# Patient Record
Sex: Female | Born: 1940 | Race: Black or African American | Hispanic: No | Marital: Married | Smoking: Former smoker
Health system: Southern US, Community
[De-identification: ages and names within clinical notes are randomized; demographics above are authoritative.]

## PROBLEM LIST (undated history)

## (undated) DIAGNOSIS — K219 Gastro-esophageal reflux disease without esophagitis: Secondary | ICD-10-CM

## (undated) DIAGNOSIS — E042 Nontoxic multinodular goiter: Secondary | ICD-10-CM

## (undated) DIAGNOSIS — D649 Anemia, unspecified: Secondary | ICD-10-CM

## (undated) DIAGNOSIS — I10 Essential (primary) hypertension: Secondary | ICD-10-CM

## (undated) DIAGNOSIS — J189 Pneumonia, unspecified organism: Secondary | ICD-10-CM

## (undated) HISTORY — PX: MULTIPLE TOOTH EXTRACTIONS: SHX2053

## (undated) HISTORY — PX: TOE AMPUTATION: SHX809

## (undated) HISTORY — DX: Essential (primary) hypertension: I10

---

## 2015-04-12 ENCOUNTER — Other Ambulatory Visit (HOSPITAL_COMMUNITY): Payer: Self-pay | Admitting: "Endocrinology

## 2015-04-12 DIAGNOSIS — E049 Nontoxic goiter, unspecified: Secondary | ICD-10-CM

## 2015-04-19 ENCOUNTER — Ambulatory Visit (HOSPITAL_COMMUNITY)
Admission: RE | Admit: 2015-04-19 | Discharge: 2015-04-19 | Disposition: A | Discharge status: Home / Self Care | Payer: Medicare Other | Source: Ambulatory Visit | Attending: "Endocrinology | Admitting: "Endocrinology

## 2015-04-19 DIAGNOSIS — E042 Nontoxic multinodular goiter: Secondary | ICD-10-CM | POA: Diagnosis not present

## 2015-04-19 DIAGNOSIS — E049 Nontoxic goiter, unspecified: Secondary | ICD-10-CM

## 2015-04-20 ENCOUNTER — Ambulatory Visit (HOSPITAL_COMMUNITY): Payer: Medicare Other

## 2015-04-26 ENCOUNTER — Other Ambulatory Visit: Payer: Self-pay | Admitting: "Endocrinology

## 2015-04-26 DIAGNOSIS — E041 Nontoxic single thyroid nodule: Secondary | ICD-10-CM

## 2015-05-02 ENCOUNTER — Other Ambulatory Visit: Payer: Self-pay | Admitting: "Endocrinology

## 2015-05-02 ENCOUNTER — Ambulatory Visit (HOSPITAL_COMMUNITY)
Admission: RE | Admit: 2015-05-02 | Discharge: 2015-05-02 | Disposition: A | Discharge status: Home / Self Care | Payer: Medicare Other | Source: Ambulatory Visit | Attending: "Endocrinology | Admitting: "Endocrinology

## 2015-05-02 DIAGNOSIS — E042 Nontoxic multinodular goiter: Secondary | ICD-10-CM | POA: Insufficient documentation

## 2015-05-02 DIAGNOSIS — E041 Nontoxic single thyroid nodule: Secondary | ICD-10-CM

## 2015-05-02 MED ORDER — LIDOCAINE HCL (PF) 2 % IJ SOLN
INTRAMUSCULAR | Status: AC
  Administered 2015-05-02: 10 mL
  Filled 2015-05-02: qty 10

## 2015-05-02 NOTE — Procedures (Signed)
PreOperative Dx: Isthmic and RIGHT lobe thyroid nodules Postoperative Dx: Isthmic and RIGHT lobe thyroid nodules Procedure:   US guided FNA of 2 thyroid nodules Radiologist:  Tyron Russell Anesthesia:  3 ml of 2% lidocaine Specimen:  FNA x 3 of RT nodule, FNA x 3 of isthmic nodule  EBL:   < 1 ml Complications: None

## 2015-05-02 NOTE — Discharge Instructions (Signed)
Thyroid Biopsy °The thyroid gland is a butterfly-shaped gland situated in the front of the neck. It produces hormones which affect metabolism, growth and development, and body temperature. A thyroid biopsy is a procedure in which small samples of tissue or fluid are removed from the thyroid gland or mass and examined under a microscope. This test is done to determine the cause of thyroid problems, such as infection, cancer, or other thyroid problems. °There are 2 ways to obtain samples: °1. Fine needle biopsy. Samples are removed using a thin needle inserted through the skin and into the thyroid gland or mass. °2. Open biopsy. Samples are removed after a cut (incision) is made through the skin. °LET YOUR CAREGIVER KNOW ABOUT:  °· Allergies. °· Medications taken including herbs, eye drops, over-the-counter medications, and creams. °· Use of steroids (by mouth or creams). °· Previous problems with anesthetics or numbing medicine. °· Possibility of pregnancy, if this applies. °· History of blood clots (thrombophlebitis). °· History of bleeding or blood problems. °· Previous surgery. °· Other health problems. °RISKS AND COMPLICATIONS °· Bleeding from the site. The risk of bleeding is higher if you have a bleeding disorder or are taking any blood thinning medications (anticoagulants). °· Infection. °· Injury to structures near the thyroid gland. °BEFORE THE PROCEDURE  °This is a procedure that can be done as an outpatient. Confirm the time that you need to arrive for your procedure. Confirm whether there is a need to fast or withhold any medications. A blood sample may be done to determine your blood clotting time. Medicine may be given to help you relax (sedative). °PROCEDURE °Fine needle biopsy. °You will be awake during the procedure. You may be asked to lie on your back with your head tipped backward to extend your neck. Let your caregiver know if you cannot tolerate the positioning. An area on your neck will be  cleansed. A needle is inserted through the skin of your neck. You may feel a mild discomfort during this procedure. You may be asked to avoid coughing, talking, swallowing, or making sounds during some portions of the procedure. The needle is withdrawn once tissue or fluid samples have been removed. Pressure may be applied to the neck to reduce swelling and ensure that bleeding has stopped. The samples will be sent for examination.  °Open biopsy. °You will be given general anesthesia. You will be asleep during the procedure. An incision is made in your neck. A sample of thyroid tissue or the mass is removed. The tissue sample or mass will be sent for examination. The sample or mass may be examined during the biopsy. If the sample or mass contains cancer cells, some or all of the thyroid gland may be removed. The incision is closed with stitches. °AFTER THE PROCEDURE  °Your recovery will be assessed and monitored. If there are no problems, as an outpatient, you should be able to go home shortly after the procedure. °If you had a fine needle biopsy: °· You may have soreness at the biopsy site for 1 to 2 days. °If you had an open biopsy:  °· You may have soreness at the biopsy site for 3 to 4 days. °· You may have a hoarse voice or sore throat for 1 to 2 days. °Obtaining the Test Results °It is your responsibility to obtain your test results. Do not assume everything is normal if you have not heard from your caregiver or the medical facility. It is important for you to follow up   on all of your test results. °HOME CARE INSTRUCTIONS  °· Keeping your head raised on a pillow when you are lying down may ease biopsy site discomfort. °· Supporting the back of your head and neck with both hands as you sit up from a lying position may ease biopsy site discomfort. °· Only take over-the-counter or prescription medicines for pain, discomfort, or fever as directed by your caregiver. °· Throat lozenges or gargling with warm salt  water may help to soothe a sore throat. °SEEK IMMEDIATE MEDICAL CARE IF:  °· You have severe bleeding from the biopsy site. °· You have difficulty swallowing. °· You have a fever. °· You have increased pain, swelling, redness, or warmth at the biopsy site. °· You notice pus coming from the biopsy site. °· You have swollen glands (lymph nodes) in your neck. °Document Released: 06/15/2007 Document Revised: 12/13/2012 Document Reviewed: 11/10/2013 °Exit[employer]® Patient Information ©2015 [employer]. This information is not intended to replace advice given to you by your health care provider. Make sure you discuss any questions you have with your health care provider. ° °

## 2015-06-05 ENCOUNTER — Ambulatory Visit: Payer: Self-pay | Admitting: "Endocrinology

## 2015-06-06 ENCOUNTER — Ambulatory Visit: Payer: Self-pay | Admitting: "Endocrinology

## 2015-11-05 ENCOUNTER — Ambulatory Visit: Payer: Self-pay | Admitting: "Endocrinology

## 2017-04-30 IMAGING — US US THYROID BIOPSY
1 series · 11 of 11 positions shown · non-contrast
Comparison: Thyroid ultrasound 04/19/2015

CLINICAL DATA: Multiple thyroid nodules

EXAM:
ULTRASOUND GUIDED NEEDLE ASPIRATE BIOPSY OF THE THYROID GLAND
ULTRASOUND-GUIDED NEEDLE ASPIRATE BIOPSY OF ADDITIONAL THYROID
NODULE

[Series 1: us thyroid biopsy · 0.05mm/px · 11 acquisitions, 11 frames shown]
[im 1/11]
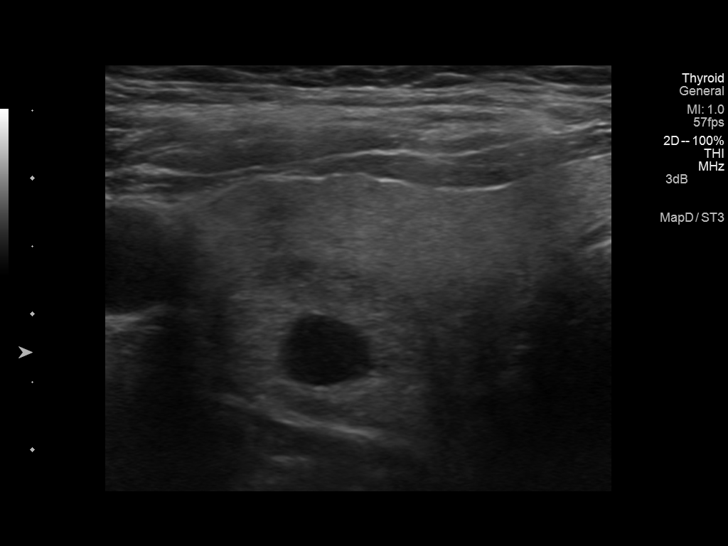
[im 2/11]
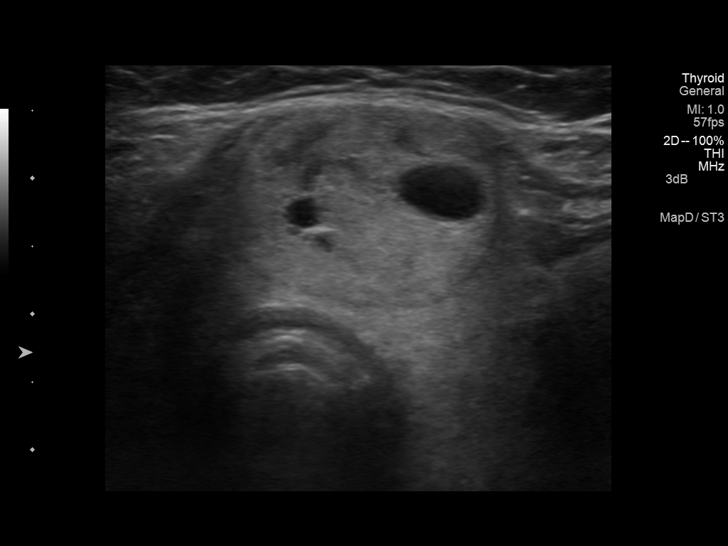
[im 3/11]
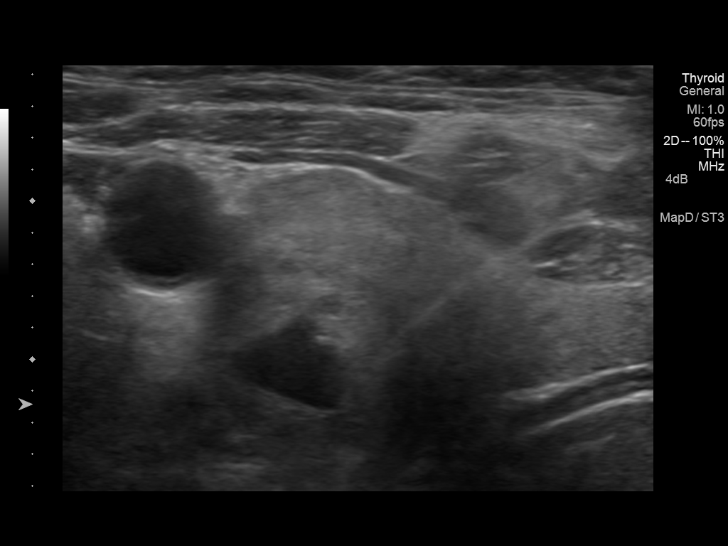
[im 4/11]
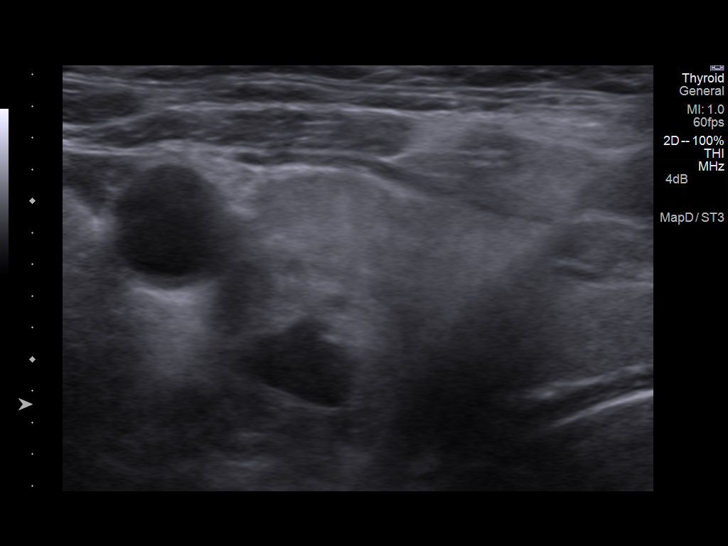
[im 5/11]
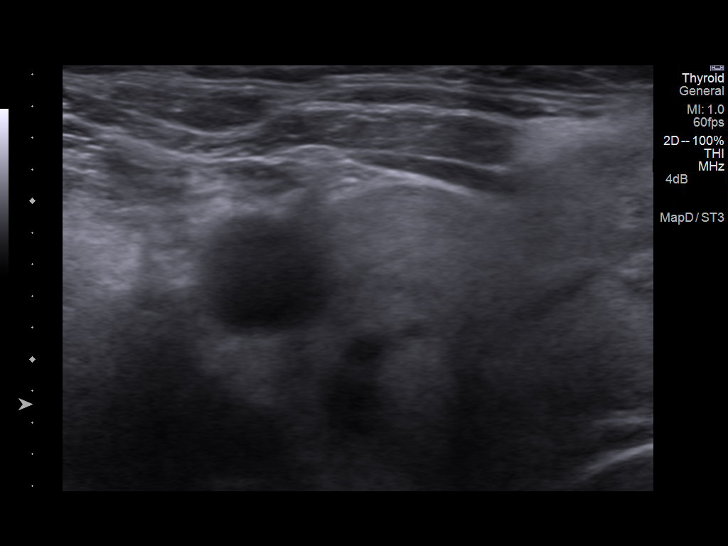
[im 6/11]
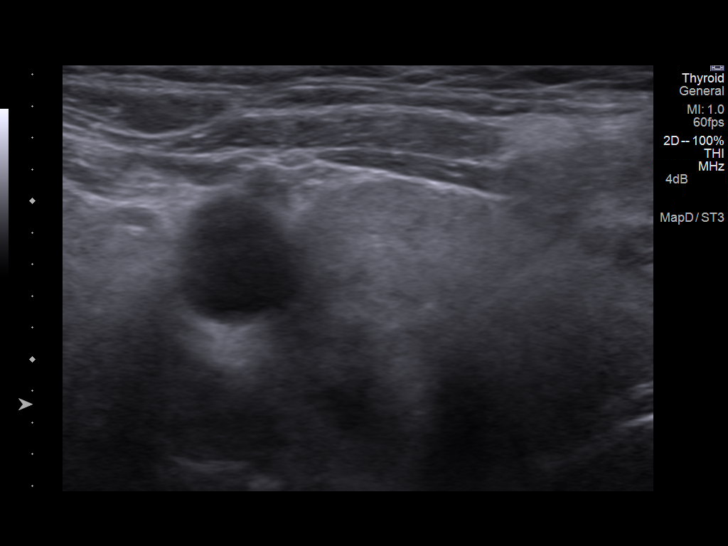
[im 7/11]
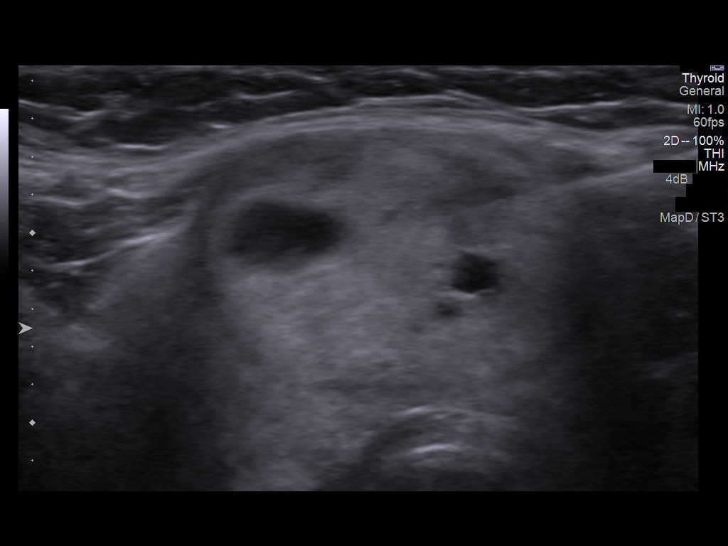
[im 8/11]
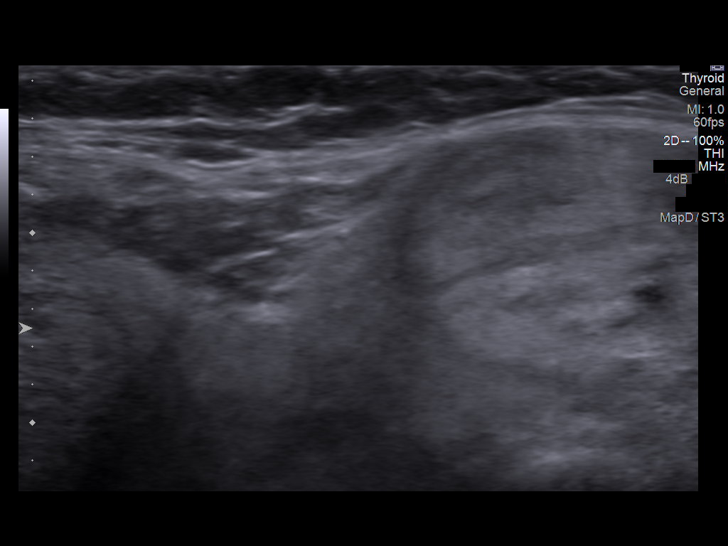
[im 9/11]
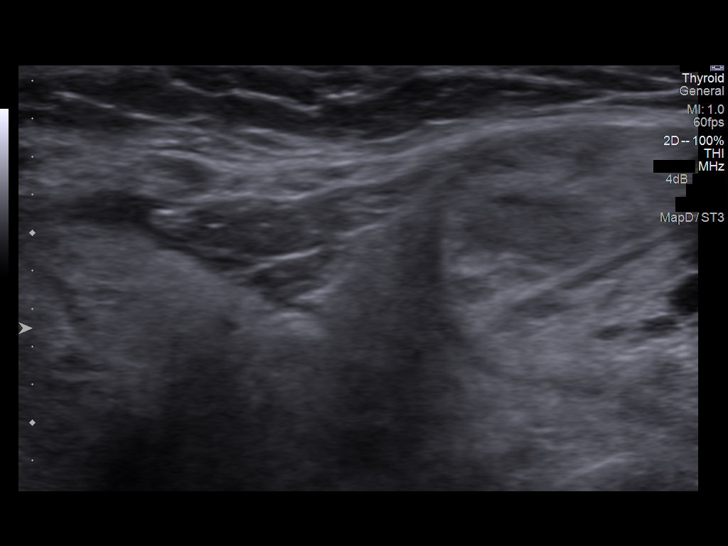
[im 10/11]
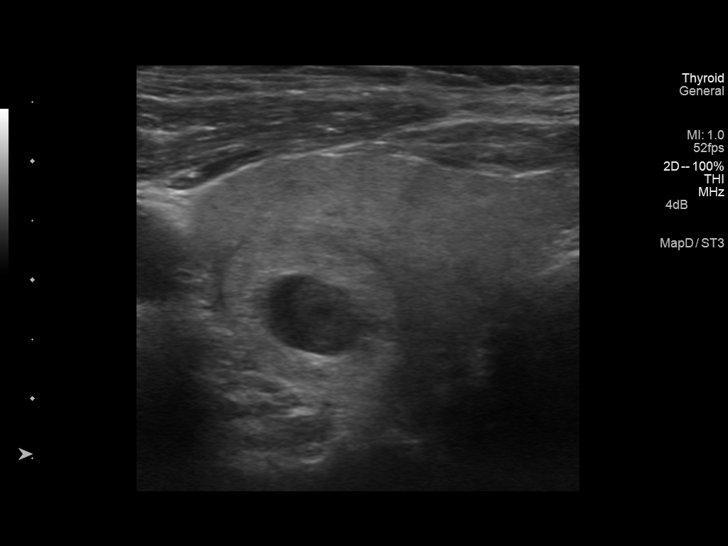
[im 11/11]
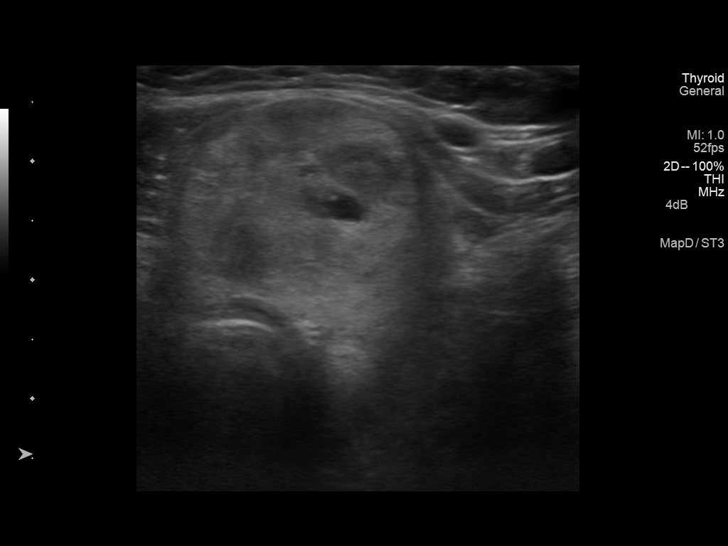

[11 of 11 positions shown; findings below may reference images not displayed]

PROCEDURE:
Procedure, risks, benefits and alternatives discussed with the
patient.

Patient's questions answered.

Written informed consent for thyroid nodule FNA obtained.

Time-out protocol followed.

Dominant nodule in RIGHT lobe localized by ultrasound, measuring
cm in transverse dimension and containing a cystic core with a thick
rim of soft tissue.

Additional nodule in thyroid isthmus localized by ultrasound,
measuring 2.1 cm transverse, predominantly solid with small cystic
foci.

Skin prepped and draped in usual sterile fashion.

Skin and overlying soft tissues anesthetized with 3 mL of 2%
lidocaine.

Under direct sonographic visualization, 3 fine-needle aspirates of
the RIGHT lobe mass were obtained.

Procedure tolerated well by patient.

Specimens of appeared visually adequate.

Subsequently, under direct sonographic visualization, 3 fine-needle
aspirates of the isthmic mass were obtained.

Specimens appeared visually adequate.

Specimens sent to cytology for evaluation.

No immediate complications; no evidence of hemorrhage on
postprocedural imaging.

COMPLICATIONS:
None
FINDINGS: As above
IMPRESSION: Ultrasound guided needle aspirate biopsy performed of of isthmic and
RIGHT lobe thyroid nodules.

## 2017-05-14 IMAGING — US US SOFT TISSUE HEAD/NECK
1 series · 14 of 25 positions shown · non-contrast
Comparison: None.

CLINICAL DATA: Goiter

EXAM:
THYROID ULTRASOUND
TECHNIQUE: Ultrasound examination of the thyroid gland and adjacent soft
tissues was performed.

[Series 1: us soft tissue head/neck · 0.06mm/px · 14 of 46 slices shown]
[im 1/46]
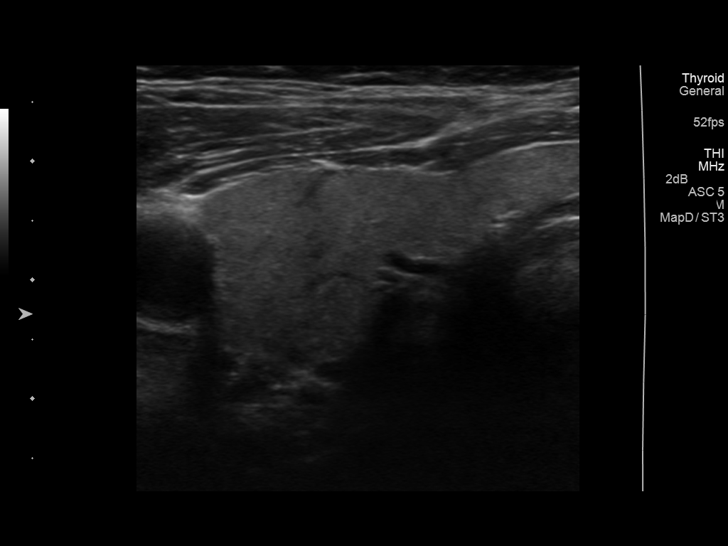
[im 4/46]
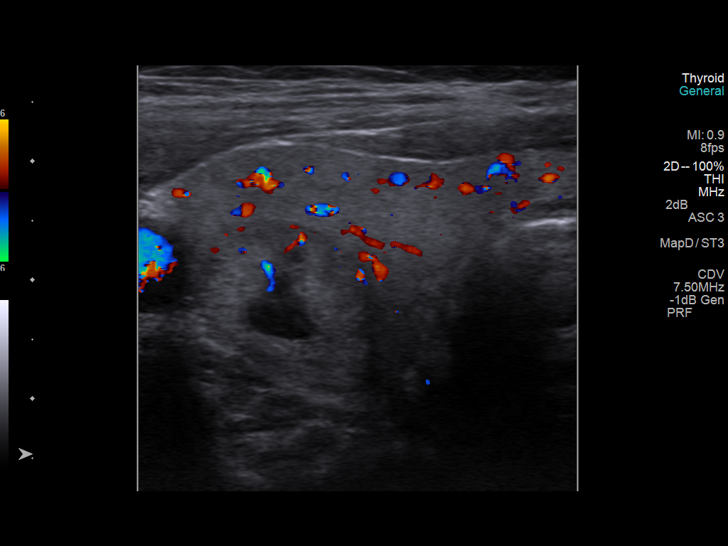
[im 8/46]
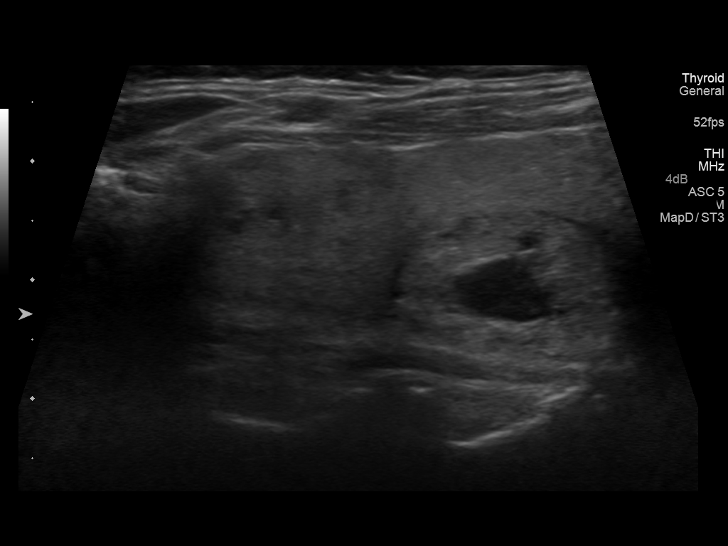
[im 12/46]
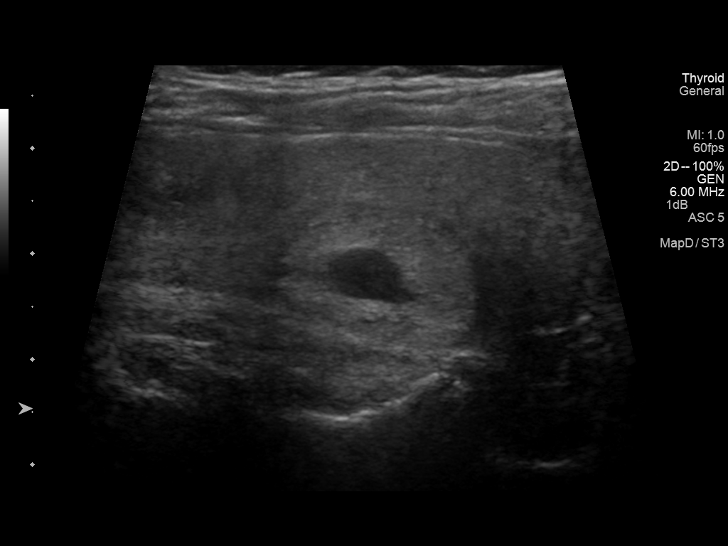
[im 16/46]
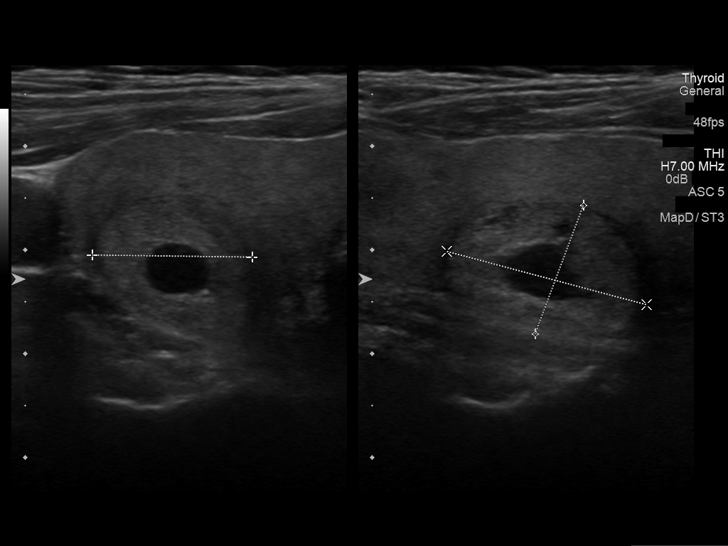
[im 17/46]
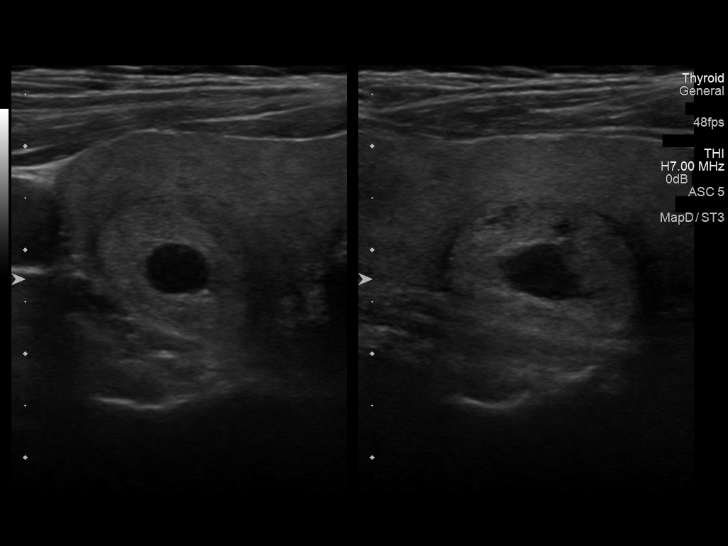
[im 21/46]
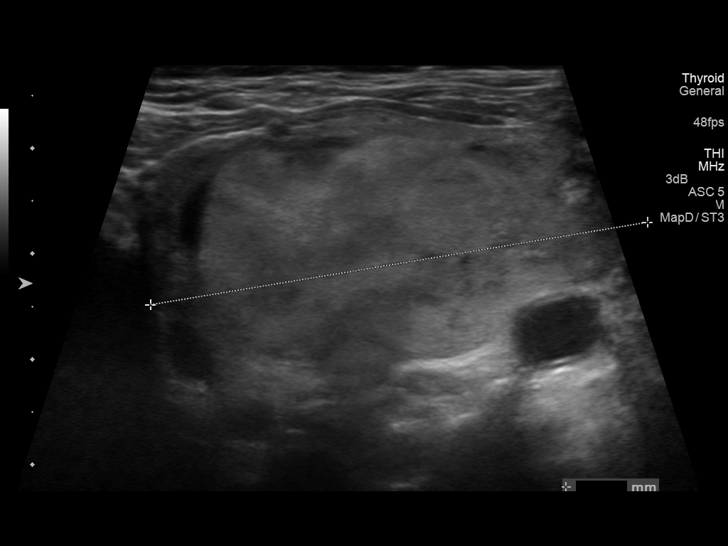
[im 25/46]
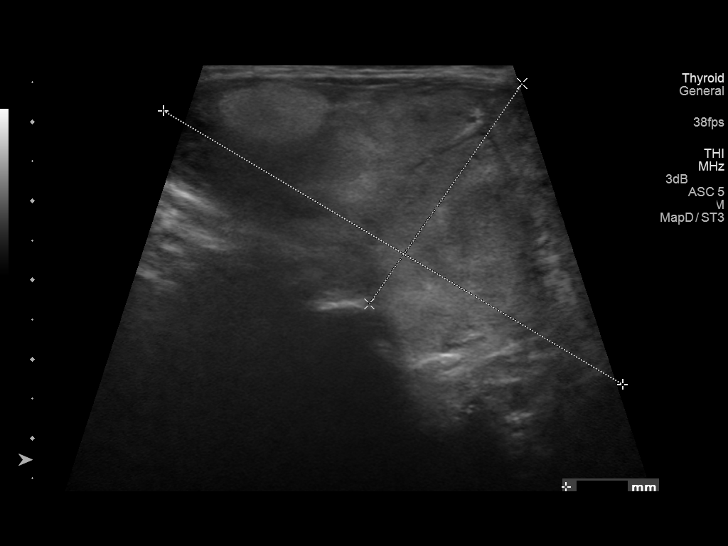
[im 29/46]
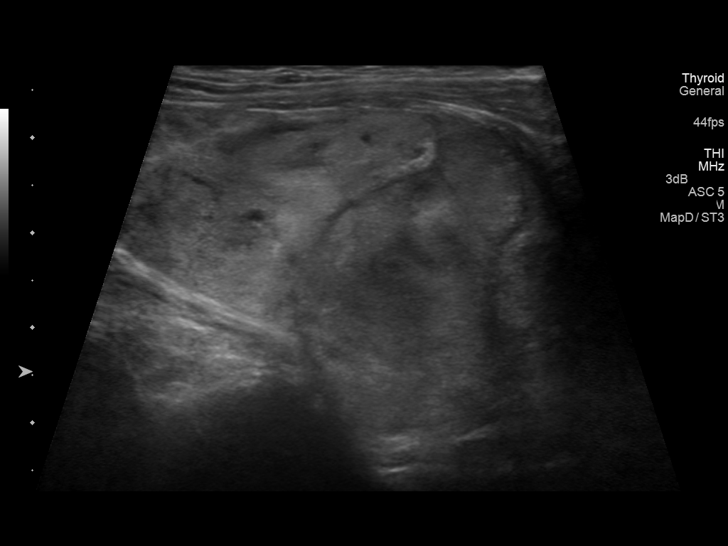
[im 31/46]
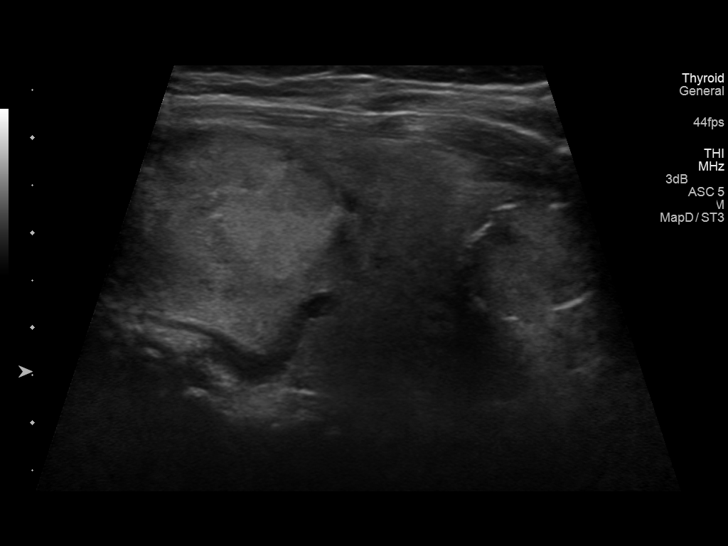
[im 34/46]
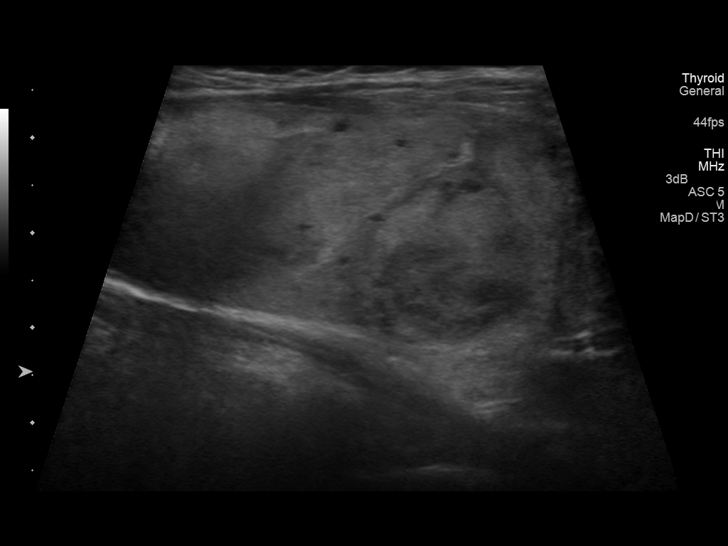
[im 38/46]
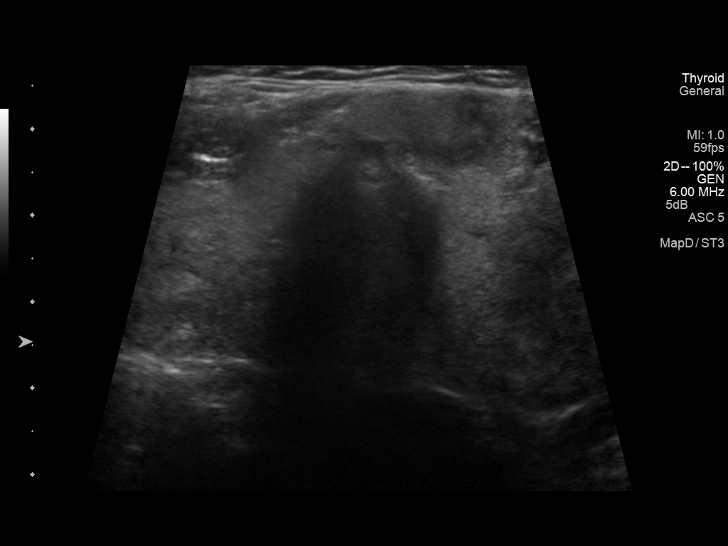
[im 42/46]
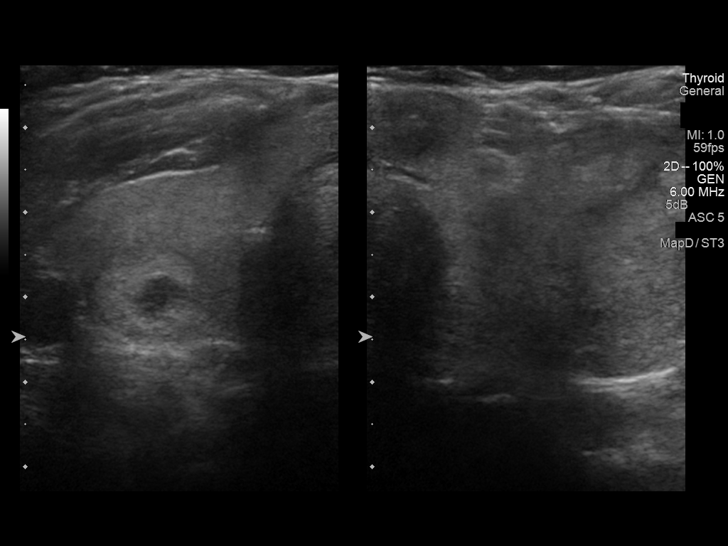
[im 46/46]
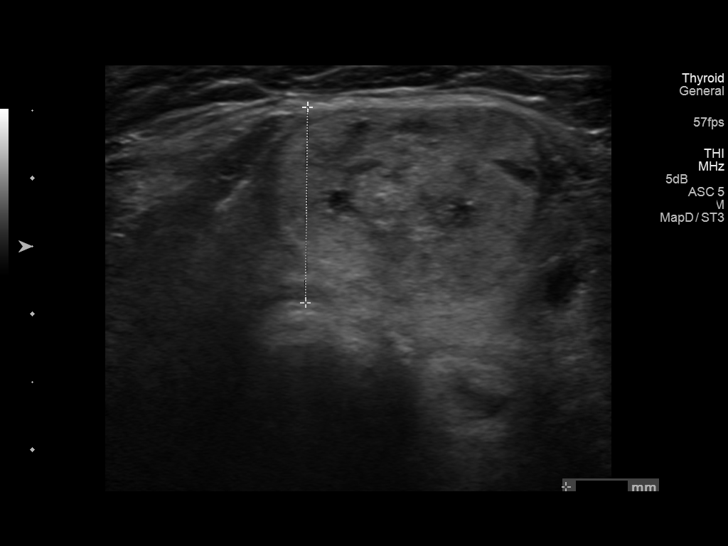

[14 of 25 positions shown; findings below may reference images not displayed]

FINDINGS: Right thyroid lobe

Measurements: 6.0 x 2.2 x 2.5 cm. Complex but predominantly solid
mid lobe nodule measures 2.0 x 1.3 x 1.5 cm. There is some internal
vascularity.

Left thyroid lobe

Measurements: 6.8 x 3.4 x 4.8 cm. Heterogeneous without focal
nodule.

Isthmus

Thickness: 1.4 cm.  Solid nodule measures 2.4 x 1.7 x 2.0 cm.

Lymphadenopathy

None visualized.
IMPRESSION: There are right and isthmic nodules as described. The dominant
nodule is in the isthmus measuring 2.4 cm. Findings meet consensus
criteria for biopsy. Ultrasound-guided fine needle aspiration should
be considered, as per the consensus statement: Management of Thyroid
Nodules Detected at US: Society of Radiologists in Ultrasound

## 2018-04-27 LAB — MICROALBUMIN, URINE: MICROALB UR: 123

## 2018-04-27 LAB — TSH: TSH: 2.81 (ref 0.41–5.90)

## 2018-04-27 LAB — BASIC METABOLIC PANEL
BUN: 16 (ref 4–21)
CREATININE: 0.8 (ref 0.5–1.1)

## 2018-04-27 LAB — LIPID PANEL
Cholesterol: 198 (ref 0–200)
HDL: 63 (ref 35–70)
LDL Cholesterol: 115
Triglycerides: 98 (ref 40–160)

## 2018-06-02 ENCOUNTER — Encounter: Payer: Self-pay | Admitting: "Endocrinology

## 2018-06-02 ENCOUNTER — Ambulatory Visit: Payer: Medicare HMO | Admitting: "Endocrinology

## 2018-06-02 VITALS — BP 104/66 | HR 80 | Ht 67.0 in | Wt 161.0 lb

## 2018-06-02 DIAGNOSIS — E042 Nontoxic multinodular goiter: Secondary | ICD-10-CM | POA: Insufficient documentation

## 2018-06-02 NOTE — Progress Notes (Signed)
Endocrinology Consult Note                                            06/02/2018, 7:06 PM   Subjective:    Patient ID: Briana Koch, female    DOB: 06-21-1941, PCP Aggie Cosier, MD   Past Medical History:  Diagnosis Date  . Hypertension    History reviewed. No pertinent surgical history. Social History   Socioeconomic History  . Marital status: Married    Spouse name: Not on file  . Number of children: Not on file  . Years of education: Not on file  . Highest education level: Not on file  Occupational History  . Not on file  Social Needs  . Financial resource strain: Not on file  . Food insecurity:    Worry: Not on file    Inability: Not on file  . Transportation needs:    Medical: Not on file    Non-medical: Not on file  Tobacco Use  . Smoking status: Former Smoker    Types: Cigarettes    Last attempt to quit: 2019    Years since quitting: 0.7  . Smokeless tobacco: Never Used  . Tobacco comment: Pt smoked 3 per week before quitting  Substance and Sexual Activity  . Alcohol use: Not Currently  . Drug use: Never  . Sexual activity: Not on file  Lifestyle  . Physical activity:    Days per week: Not on file    Minutes per session: Not on file  . Stress: Not on file  Relationships  . Social connections:    Talks on phone: Not on file    Gets together: Not on file    Attends religious service: Not on file    Active member of club or organization: Not on file    Attends meetings of clubs or organizations: Not on file    Relationship status: Not on file  Other Topics Concern  . Not on file  Social History Narrative  . Not on file   Outpatient Encounter Medications as of 06/02/2018  Medication Sig  . aspirin EC 81 MG tablet Take 81 mg by mouth daily.  Marland Kitchen amLODipine (NORVASC) 5 MG tablet Take 5 mg by mouth daily.  Marland Kitchen losartan-hydrochlorothiazide (HYZAAR) 100-25 MG tablet daily.   No facility-administered encounter medications on file as of  06/02/2018.    ALLERGIES: No Known Allergies  VACCINATION STATUS:  There is no immunization history on file for this patient.  HPI Briana Koch is 77 y.o. female who presents today with a medical history as above. -This patient was seen previously in this practice for multinodular goiter in 2016 after she has seen Dr. Renaee Munda in [place] for nodular goiter for several years. -She did not return for follow-up until she was rereferred for the same problem. -She is known to have long-standing nodular goiter with little change over time. In 2016 thyroid ultrasound showed right lobe measuring 6 cm with 2.4 cm nodule which was  biopsied with benign findings, left lobe 6.8 cm with unremarkable nodular lesions. Her most recent ultrasound was from March 17, 2017-summarized below. -She continues to have some choking sensation when she lays on her back.  She denies dysphagia, shortness of breath, nor voice change.  -  She has not noticed any recent change in the size of her thyroid.  She is a former heavy smoker.  She denies any family history of thyroid cancer.  Her recent thyroid function tests are within normal limits.    Review of Systems  Constitutional: + Progressive weight gain,  no fatigue, no subjective hyperthermia, no subjective hypothermia Eyes: no blurry vision, no xerophthalmia ENT: no sore throat, no nodules palpated in throat, no dysphagia/odynophagia, no hoarseness, + occasional choking on laying down on her back Cardiovascular: no Chest Pain, no Shortness of Breath, no palpitations, no leg swelling Respiratory: no cough, no SOB Gastrointestinal: no Nausea/Vomiting/Diarhhea Musculoskeletal: no muscle/joint aches Skin: no rashes Neurological: no tremors, no numbness, no tingling, no dizziness Psychiatric: no depression, no anxiety  Objective:    BP 104/66   Pulse 80   Ht 5\' 7"  (1.702 m)   Wt 161 lb (73 kg)   BMI 25.22 kg/m   Wt Readings from Last 3 Encounters:   06/02/18 161 lb (73 kg)    Physical Exam  Constitutional: + Appropriate weight for height, not in acute distress, normal state of mind Eyes: PERRLA, EOMI, no exophthalmos ENT: moist mucous membranes, + visible and palpable thyromegaly with prominent nodule on the right isthmus, it is mobile on swallowing, no cervical lymphadenopathy Cardiovascular: normal precordial activity, Regular Rate and Rhythm, no Murmur/Rubs/Gallops Respiratory:  adequate breathing efforts, no gross chest deformity, Clear to auscultation bilaterally Gastrointestinal: abdomen soft, Non -tender, No distension, Bowel Sounds present Musculoskeletal: no gross deformities, strength intact in all four extremities Skin: moist, warm, no rashes Neurological: no tremor with outstretched hands, Deep tendon reflexes normal in all four extremities.  Thyroid ultrasound on March 17, 2017: Summary Right lobe 6.0 cm (previously 6 cm) with 2.3 cm nodule previously biopsied with benign findings, left lobe 7.3 cm with  nodules (previously 6.8 cm).  The left lobe extends below the clavicle. -There are multiple bilateral nodules, largest is on the right described above. Impression: No significant change since the prior study and re demonstration of thyroid thyromegaly and bilateral thyroid nodules largest in the isthmus as described above.  Recent Results (from the past 2160 hour(s))  Microalbumin, urine     Status: None   Collection Time: 04/27/18 12:00 AM  Result Value Ref Range   Microalb, Ur 123   Basic metabolic panel     Status: None   Collection Time: 04/27/18 12:00 AM  Result Value Ref Range   BUN 16 4 - 21   Creatinine 0.8 0.5 - 1.1  Lipid panel     Status: None   Collection Time: 04/27/18 12:00 AM  Result Value Ref Range   Triglycerides 98 40 - 160   Cholesterol 198 0 - 200   HDL 63 35 - 70   LDL Cholesterol 115   TSH     Status: None   Collection Time: 04/27/18 12:00 AM  Result Value Ref Range   TSH 2.81 0.41 -  5.90    Comment:  free t4 1.02     Assessment & Plan:   1. Multinodular goiter   - Yumi Therien  is being seen at a kind request of Aggie Cosier, MD. - I have reviewed her available thyroid records and clinically evaluated the patient. - Based on reviews, she has euthyroid multinodular goiter. -She has mild compressive symptoms of feeling choking in certain positions.  -Large benign multinodular goiter with significant compressive neck symptoms may warrant surgical intervention. -She wishes to avoid/delay surgery for now. -The prominent nodule in the right isthmus was biopsied and known  to be benign. -She will be on observation for physical exam and with another ultrasound in 1 year.  -Her thyroid function tests are within normal limits, would not require intervention at this time.  - I did not initiate any new prescriptions today. - I advised her  to maintain close follow up with Aggie Cosier, MD for primary care needs.   - Time spent with the patient: 45 minutes, of which >50% was spent in obtaining information about her symptoms, reviewing her previous labs, evaluations, and treatments, counseling her about her long-standing multinodular goiter, and developing a plan to confirm the diagnosis and long term treatment as necessary.  Bettyanne Casimir participated in the discussions, expressed understanding, and voiced agreement with the above plans.  All questions were answered to her satisfaction. she is encouraged to contact clinic should she have any questions or concerns prior to her return visit.  Follow up plan: Return in about 1 year (around 06/03/2019) for Follow up with Pre-visit Labs.   Marquis Lunch, MD [facility] [address] [place], [place] [address] Phone: [phone]  Fax: [phone]     06/02/2018, 7:06 PM  This note was partially dictated with voice recognition software. Similar sounding words can  be transcribed inadequately or may not  be corrected upon review.

## 2019-06-03 ENCOUNTER — Ambulatory Visit: Payer: Medicare HMO | Admitting: "Endocrinology

## 2019-06-09 ENCOUNTER — Other Ambulatory Visit: Payer: Self-pay | Admitting: "Endocrinology

## 2019-06-10 LAB — T3, FREE: T3, Free: 3.1 pg/mL (ref 2.0–4.4)

## 2019-06-10 LAB — T4, FREE: Free T4: 1.28 ng/dL (ref 0.82–1.77)

## 2019-06-10 LAB — TSH: TSH: 1.54 u[IU]/mL (ref 0.450–4.500)

## 2019-07-11 ENCOUNTER — Encounter: Payer: Self-pay | Admitting: "Endocrinology

## 2019-07-11 ENCOUNTER — Other Ambulatory Visit: Payer: Self-pay

## 2019-07-11 ENCOUNTER — Ambulatory Visit (INDEPENDENT_AMBULATORY_CARE_PROVIDER_SITE_OTHER): Payer: Medicare HMO | Admitting: "Endocrinology

## 2019-07-11 DIAGNOSIS — E042 Nontoxic multinodular goiter: Secondary | ICD-10-CM

## 2019-07-11 NOTE — Progress Notes (Signed)
07/11/2019, 3:44 PM                                Endocrinology Telehealth Visit Follow up Note -During COVID -19 Pandemic  I connected with Briana Koch on 07/11/2019   by telephone and verified that I am speaking with the correct person using two identifiers. Briana Koch, 08-31-1941. she has verbally consented to this visit. All issues noted in this document were discussed and addressed. The format was not optimal for physical exam.   Subjective:    Patient ID: Briana Koch, female    DOB: 1941-06-29, PCP Kennieth Rad, MD   Past Medical History:  Diagnosis Date  . Hypertension    History reviewed. No pertinent surgical history. Social History   Socioeconomic History  . Marital status: Married    Spouse name: Not on file  . Number of children: Not on file  . Years of education: Not on file  . Highest education level: Not on file  Occupational History  . Not on file  Social Needs  . Financial resource strain: Not on file  . Food insecurity    Worry: Not on file    Inability: Not on file  . Transportation needs    Medical: Not on file    Non-medical: Not on file  Tobacco Use  . Smoking status: Former Smoker    Types: Cigarettes    Quit date: 2019    Years since quitting: 1.8  . Smokeless tobacco: Never Used  . Tobacco comment: Pt smoked 3 per week before quitting  Substance and Sexual Activity  . Alcohol use: Not Currently  . Drug use: Never  . Sexual activity: Not on file  Lifestyle  . Physical activity    Days per week: Not on file    Minutes per session: Not on file  . Stress: Not on file  Relationships  . Social Herbalist on phone: Not on file    Gets together: Not on file    Attends religious service: Not on file    Active member of club or organization: Not on file    Attends meetings of clubs or organizations: Not on file    Relationship status: Not on file  Other Topics  Concern  . Not on file  Social History Narrative  . Not on file   Outpatient Encounter Medications as of 07/11/2019  Medication Sig  . amLODipine (NORVASC) 5 MG tablet Take 5 mg by mouth daily.  Marland Kitchen aspirin EC 81 MG tablet Take 81 mg by mouth daily.  Marland Kitchen losartan-hydrochlorothiazide (HYZAAR) 100-25 MG tablet daily.   No facility-administered encounter medications on file as of 07/11/2019.    ALLERGIES: No Known Allergies  VACCINATION STATUS:  There is no immunization history on file for this patient.  HPI Briana Koch is 78 y.o. female who presents today with a medical history as above. -This patient was seen previously in this practice for multinodular goiter in 2016 after she has seen Dr. Myles Gip in [place] for nodular goiter for several years.  -She is known to have long-standing nodular goiter with little change over time. In  2016 thyroid ultrasound showed right lobe measuring 6 cm with 2.4 cm nodule which was  biopsied with benign findings, left lobe 6.8 cm with unremarkable nodular lesions. Her most recent ultrasound was from March 17, 2017-summarized below. -She continues to have some choking sensation when she lays on her back.  She denies dysphagia, shortness of breath, nor voice change.  -  She has  noticed some recent change in the size of her thyroid.  She is a former heavy smoker.  She denies any family history of thyroid cancer.  Her recent thyroid function tests are within normal limits.     Review of Systems  Limited as above.  Objective:    There were no vitals taken for this visit.  Wt Readings from Last 3 Encounters:  06/02/18 161 lb (73 kg)    Physical Exam   Thyroid ultrasound on March 17, 2017: Summary Right lobe 6.0 cm (previously 6 cm) with 2.3 cm nodule previously biopsied with benign findings, left lobe 7.3 cm with  nodules (previously 6.8 cm).  The left lobe extends below the clavicle. -There are multiple bilateral nodules, largest is on the  right described above. Impression: No significant change since the prior study and re demonstration of thyroid thyromegaly and bilateral thyroid nodules largest in the isthmus as described above.  Recent Results (from the past 2160 hour(s))  T4, free     Status: None   Collection Time: 06/09/19  1:12 PM  Result Value Ref Range   Free T4 1.28 0.82 - 1.77 ng/dL  TSH     Status: None   Collection Time: 06/09/19  1:12 PM  Result Value Ref Range   TSH 1.540 0.450 - 4.500 uIU/mL  T3, free     Status: None   Collection Time: 06/09/19  1:12 PM  Result Value Ref Range   T3, Free 3.1 2.0 - 4.4 pg/mL     Assessment & Plan:   1. Multinodular goiter   - Coti Weikel  is being seen at a kind request of Aggie Cosier, MD. - I have reviewed her available thyroid records and clinically evaluated the patient. - Based on reviews, she has euthyroid multinodular goiter. -She has mild compressive symptoms of feeling choking in certain positions.  -Large benign multinodular goiter with significant compressive neck symptoms may warrant surgical intervention. -She wishes to avoid/delay surgery for now. -The prominent nodule in the right isthmus was biopsied and known to be benign. -She will be on observation for physical exam and with another ultrasound in 6 months. -Her thyroid function tests are within normal limits, will not require intervention at this time.  - I did not initiate any new prescriptions today. - I advised her  to maintain close follow up with Aggie Cosier, MD for primary care needs.   Time for this visit: 15 minutes. Chee Lunsford  participated in the discussions, expressed understanding, and voiced agreement with the above plans.  All questions were answered to her satisfaction. she is encouraged to contact clinic should she have any questions or concerns prior to her return visit.   Follow up plan: Return in about 4 months (around 11/08/2019) for Thyroid / Neck  Ultrasound.   Marquis Lunch, MD [facility] [address] [place], [place] [address] Phone: [phone]  Fax: [phone]     07/11/2019, 3:44 PM  This note was partially dictated with voice recognition software. Similar sounding words can be transcribed inadequately or may not  be corrected upon review.

## 2019-11-09 ENCOUNTER — Ambulatory Visit (INDEPENDENT_AMBULATORY_CARE_PROVIDER_SITE_OTHER): Payer: Medicare PPO | Admitting: "Endocrinology

## 2019-11-09 ENCOUNTER — Encounter: Payer: Self-pay | Admitting: "Endocrinology

## 2019-11-09 DIAGNOSIS — E042 Nontoxic multinodular goiter: Secondary | ICD-10-CM

## 2019-11-09 NOTE — Progress Notes (Signed)
11/09/2019, 5:00 PM                                   Endocrinology Telehealth Visit Follow up Note -During COVID -19 Pandemic  I connected with Briana Koch on 11/09/2019   by telephone and verified that I am speaking with the correct person using two identifiers. Briana Koch, [birth year]. she has verbally consented to this visit. All issues noted in this document were discussed and addressed. The format was not optimal for physical exam.   Subjective:    Patient ID: Briana Koch, female    DOB: 1940-11-28, PCP Aggie Cosier, MD   Past Medical History:  Diagnosis Date  . Hypertension    History reviewed. No pertinent surgical history. Social History   Socioeconomic History  . Marital status: Married    Spouse name: Not on file  . Number of children: Not on file  . Years of education: Not on file  . Highest education level: Not on file  Occupational History  . Not on file  Tobacco Use  . Smoking status: Former Smoker    Types: Cigarettes    Quit date: 2019    Years since quitting: 2.1  . Smokeless tobacco: Never Used  . Tobacco comment: Pt smoked 3 per week before quitting  Substance and Sexual Activity  . Alcohol use: Not Currently  . Drug use: Never  . Sexual activity: Not on file  Other Topics Concern  . Not on file  Social History Narrative  . Not on file   Social Determinants of Health   Financial Resource Strain:   . Difficulty of Paying Living Expenses: Not on file  Food Insecurity:   . Worried About Programme researcher, broadcasting/film/video in the Last Year: Not on file  . Ran Out of Food in the Last Year: Not on file  Transportation Needs:   . Lack of Transportation (Medical): Not on file  . Lack of Transportation (Non-Medical): Not on file  Physical Activity:   . Days of Exercise per Week: Not on file  . Minutes of Exercise per Session: Not on file  Stress:   . Feeling of Stress : Not on file  Social  Connections:   . Frequency of Communication with Friends and Family: Not on file  . Frequency of Social Gatherings with Friends and Family: Not on file  . Attends Religious Services: Not on file  . Active Member of Clubs or Organizations: Not on file  . Attends Banker Meetings: Not on file  . Marital Status: Not on file   Outpatient Encounter Medications as of 11/09/2019  Medication Sig  . amLODipine (NORVASC) 5 MG tablet Take 5 mg by mouth daily.  Marland Kitchen aspirin EC 81 MG tablet Take 81 mg by mouth daily.  Marland Kitchen losartan-hydrochlorothiazide (HYZAAR) 100-25 MG tablet daily.   No facility-administered encounter medications on file as of 11/09/2019.   ALLERGIES: No Known Allergies  VACCINATION STATUS:  There is no immunization history on file for this patient.  HPI Briana Koch is 79 y.o. female who is being engaged in telehealth via telephone for follow-up of multinodular goiter.    -  This patient was seen previously in this practice for multinodular goiter known since March 2015.   -She saw Dr. Renaee Munda in [place] for nodular goiter for several years.  -She is known to have long-standing nodular goiter with little change over time. Her most recent ultrasound prior to this visit on October 10, 2019 shows right lobe measuring 5.5 cm in the left lobe measuring 7.3 cm. -The ultrasound confirms several nodules with either similar size in the future, slight decrease, with insignificant growth compared to her prior studies.  -She denies any compression symptoms this time.    She denies dysphagia, shortness of breath, nor voice change.  -  She has  noticed some recent change in the size of her thyroid.  She is a former heavy smoker.  She denies any family history of thyroid cancer.  Her recent thyroid function tests are within normal limits.    -She denies palpitations, tremors, nor heat/cold intolerance.   Review of Systems  Limited as above.  Objective:    There were no  vitals taken for this visit.  Wt Readings from Last 3 Encounters:  06/02/18 161 lb (73 kg)    Physical Exam    Most recent thyroid ultrasound October 10, 2019: Right lobe 5.5 x 2.5 x 2.6 cm.  Left lobe 7.3 x 3.8 x 4.6 cm.  Isthmus nodule 2.6 cm, previously 2.8 cm.  Right lobe nodule 2.2 cm previously 2.1 cm.  Left lobe nodule 4.6 cm, previously 4.7 cm.  In the left lobe nodule 3.1cm, previously 3 cm.  Left lobe nodule 3 cm, previously 2.5 cm.   Thyroid ultrasound on March 17, 2017: Summary Right lobe 6.0 cm (previously 6 cm) with 2.3 cm nodule previously biopsied with benign findings, left lobe 7.3 cm with  nodules (previously 6.8 cm).  The left lobe extends below the clavicle. -There are multiple bilateral nodules, largest is on the right described above. Impression: No significant change since the prior study and re demonstration of thyroid thyromegaly and bilateral thyroid nodules largest in the isthmus as described above.  Results for PERLIE, STENE (MRN [id number]) as of 11/09/2019 17:04  Ref. Range 06/09/2019 13:12  TSH Latest Ref Range: 0.450 - 4.500 uIU/mL 1.540  Triiodothyronine,Free,Serum Latest Ref Range: 2.0 - 4.4 pg/mL 3.1  T4,Free(Direct) Latest Ref Range: 0.82 - 1.77 ng/dL 9.32    Assessment & Plan:   1. Multinodular goiter  - I have reviewed her repeat thyroid work-up studies showing euthyroid multinodular goiter with stable features of nodules or more than 5 years.   -Although she did have mild compressive symptoms before, currently she denies any significant choking, voice change, no difficulty breathing.  She wishes to avoid surgery if possible. -The dominant nodule in the right  isthmus was biopsied and known to be benign. -She will be on observation for physical exam and thyroid function test in 6 months with office visit.   -She maintains euthyroid state, will not require prescription for thyroid hormone at this time. - I advised her  to maintain close follow up  with Aggie Cosier, MD for primary care needs.     - Time spent on this patient care encounter:  25 minutes of which 50% was spent in  counseling and the rest reviewing  her current and  previous labs / studies and medications  doses and developing a plan for long term care. Lyndzie Woody  participated in the discussions, expressed understanding, and voiced agreement with the above plans.  All questions  were answered to her satisfaction. she is encouraged to contact clinic should she have any questions or concerns prior to her return visit.   Follow up plan: Return in about 6 months (around 05/11/2020), or office, for Follow up with Pre-visit Labs.   Glade Lloyd, MD [facility] [address] [place], [place] [address] Phone: [phone]  Fax: [phone]     11/09/2019, 5:00 PM  This note was partially dictated with voice recognition software. Similar sounding words can be transcribed inadequately or may not  be corrected upon review.

## 2020-05-11 ENCOUNTER — Ambulatory Visit: Payer: Medicare PPO | Admitting: "Endocrinology

## 2020-05-14 LAB — TSH: TSH: 1.12 (ref 0.41–5.90)

## 2020-05-23 ENCOUNTER — Encounter: Payer: Self-pay | Admitting: "Endocrinology

## 2020-05-23 ENCOUNTER — Ambulatory Visit: Payer: Medicare PPO | Admitting: "Endocrinology

## 2020-05-23 ENCOUNTER — Other Ambulatory Visit: Payer: Self-pay

## 2020-05-23 VITALS — BP 114/74 | HR 84 | Ht 67.0 in | Wt 148.8 lb

## 2020-05-23 DIAGNOSIS — E042 Nontoxic multinodular goiter: Secondary | ICD-10-CM

## 2020-05-23 NOTE — Progress Notes (Signed)
05/23/2020, 8:56 PM  Endocrinology follow-up note   Subjective:    Patient ID: Briana Koch, female    DOB: 1941-06-21, PCP Aggie Cosier, MD   Past Medical History:  Diagnosis Date  . Hypertension    History reviewed. No pertinent surgical history. Social History   Socioeconomic History  . Marital status: Married    Spouse name: Not on file  . Number of children: Not on file  . Years of education: Not on file  . Highest education level: Not on file  Occupational History  . Not on file  Tobacco Use  . Smoking status: Former Smoker    Types: Cigarettes    Quit date: 2019    Years since quitting: 2.7  . Smokeless tobacco: Never Used  . Tobacco comment: Pt smoked 3 per week before quitting  Vaping Use  . Vaping Use: Never used  Substance and Sexual Activity  . Alcohol use: Not Currently  . Drug use: Never  . Sexual activity: Not on file  Other Topics Concern  . Not on file  Social History Narrative  . Not on file   Social Determinants of Health   Financial Resource Strain:   . Difficulty of Paying Living Expenses: Not on file  Food Insecurity:   . Worried About Programme researcher, broadcasting/film/video in the Last Year: Not on file  . Ran Out of Food in the Last Year: Not on file  Transportation Needs:   . Lack of Transportation (Medical): Not on file  . Lack of Transportation (Non-Medical): Not on file  Physical Activity:   . Days of Exercise per Week: Not on file  . Minutes of Exercise per Session: Not on file  Stress:   . Feeling of Stress : Not on file  Social Connections:   . Frequency of Communication with Friends and Family: Not on file  . Frequency of Social Gatherings with Friends and Family: Not on file  . Attends Religious Services: Not on file  . Active Member of Clubs or Organizations: Not on file  . Attends Banker Meetings: Not on file  . Marital Status: Not on file   Outpatient Encounter  Medications as of 05/23/2020  Medication Sig  . losartan (COZAAR) 100 MG tablet Take 100 mg by mouth daily.  Marland Kitchen amLODipine (NORVASC) 5 MG tablet Take 5 mg by mouth daily.  . hydrochlorothiazide (HYDRODIURIL) 25 MG tablet Take 1 tablet by mouth daily.  . [DISCONTINUED] aspirin EC 81 MG tablet Take 81 mg by mouth daily.  . [DISCONTINUED] losartan-hydrochlorothiazide (HYZAAR) 100-25 MG tablet daily.   No facility-administered encounter medications on file as of 05/23/2020.   ALLERGIES: No Known Allergies  VACCINATION STATUS:  There is no immunization history on file for this patient.  HPI Briana Koch is 79 y.o. female who is being seen in follow-up for euthyroid multinodular goiter.    -This patient was seen previously in this practice for multinodular goiter known since March 2015.   -She saw Dr. Renaee Munda in [place] for nodular goiter for several years.  -She is known to have long-standing nodular goiter with little change over time. Her most recent ultrasound prior to this visit on October 10, 2019 shows  right lobe measuring 5.5 cm in the left lobe measuring 7.3 cm. -The ultrasound confirms several nodules with either similar size in the future, slight decrease, with insignificant growth compared to her prior studies.  -She denies any compression symptoms this time.    She denies dysphagia, shortness of breath, nor voice change.  -  She denies any change in the size of her thyroid.  Her previsit thyroid function tests are within normal limits.   She is a former heavy smoker.  She denies any family history of thyroid cancer.  -She denies palpitations, tremors, nor heat/cold intolerance.   Review of Systems  Limited as above.  Objective:    BP 114/74   Pulse 84   Ht 5\' 7"  (1.702 m)   Wt 148 lb 12.8 oz (67.5 kg)   BMI 23.31 kg/m   Wt Readings from Last 3 Encounters:  05/23/20 148 lb 12.8 oz (67.5 kg)  06/02/18 161 lb (73 kg)    Physical Exam    Most recent thyroid  ultrasound October 10, 2019: Right lobe 5.5 x 2.5 x 2.6 cm.  Left lobe 7.3 x 3.8 x 4.6 cm.  Isthmus nodule 2.6 cm, previously 2.8 cm.  Right lobe nodule 2.2 cm previously 2.1 cm.  Left lobe nodule 4.6 cm, previously 4.7 cm.  In the left lobe nodule 3.1cm, previously 3 cm.  Left lobe nodule 3 cm, previously 2.5 cm.   Thyroid ultrasound on March 17, 2017: Summary Right lobe 6.0 cm (previously 6 cm) with 2.3 cm nodule previously biopsied with benign findings, left lobe 7.3 cm with  nodules (previously 6.8 cm).  The left lobe extends below the clavicle. -There are multiple bilateral nodules, largest is on the right described above. Impression: No significant change since the prior study and re demonstration of thyroid thyromegaly and bilateral thyroid nodules largest in the isthmus as described above.  Results for GALIT, URICH (MRN Briana Koch) as of 11/09/2019 17:04  Ref. Range 06/09/2019 13:12  TSH Latest Ref Range: 0.450 - 4.500 uIU/mL 1.540  Triiodothyronine,Free,Serum Latest Ref Range: 2.0 - 4.4 pg/mL 3.1  T4,Free(Direct) Latest Ref Range: 0.82 - 1.77 ng/dL 08/09/2019   Recent Results (from the past 2160 hour(s))  TSH     Status: None   Collection Time: 05/14/20 12:00 AM  Result Value Ref Range   TSH 1.12 0.41 - 5.90    Comment: T3,Free 2.6, T4,Free 1.08    Assessment & Plan:   1. Multinodular goiter  - I have reviewed her repeat thyroid work-up studies showing euthyroid multinodular goiter with stable features of nodules or more than 5 years.   -Although she did have mild compressive symptoms before, currently she denies any significant choking, voice change, no difficulty breathing.  She is still wishes to avoid surgery if possible.   -The dominant nodule in the right  isthmus was biopsied and known to be benign. -She will be put on observation for physical exam , surveillance thyroid ultrasound, and thyroid function test with office visit in 1 year.     -She maintains euthyroid state,  will not require prescription for thyroid hormone at this time. - I advised her  to maintain close follow up with 05/16/20, MD for primary care needs.    - Time spent on this patient care encounter:  20 minutes of which 50% was spent in  counseling and the rest reviewing  her current and  previous labs / studies and medications  doses and developing a plan for  long term care. Tajanae Mccance  participated in the discussions, expressed understanding, and voiced agreement with the above plans.  All questions were answered to her satisfaction. she is encouraged to contact clinic should she have any questions or concerns prior to her return visit.  Follow up plan: Return in about 1 year (around 05/23/2021), or Ultrasound in [facility], for F/U with Pre-visit Labs, Thyroid / Neck Ultrasound.   Marquis Lunch, MD [facility] [address] [place], [place] [address] Phone: [phone]  Fax: [phone]     05/23/2020, 8:56 PM  This note was partially dictated with voice recognition software. Similar sounding words can be transcribed inadequately or may not  be corrected upon review.

## 2021-04-11 ENCOUNTER — Encounter: Payer: Self-pay | Admitting: "Endocrinology

## 2021-05-23 ENCOUNTER — Ambulatory Visit: Payer: Medicare PPO | Admitting: "Endocrinology

## 2021-06-03 ENCOUNTER — Other Ambulatory Visit: Payer: Self-pay

## 2021-06-03 DIAGNOSIS — E042 Nontoxic multinodular goiter: Secondary | ICD-10-CM

## 2021-06-12 LAB — TSH: TSH: 1.97 u[IU]/mL (ref 0.450–4.500)

## 2021-06-12 LAB — T4, FREE: Free T4: 1.34 ng/dL (ref 0.82–1.77)

## 2021-07-01 ENCOUNTER — Encounter: Payer: Self-pay | Admitting: "Endocrinology

## 2021-07-01 ENCOUNTER — Other Ambulatory Visit: Payer: Self-pay

## 2021-07-01 ENCOUNTER — Ambulatory Visit: Payer: Medicare PPO | Admitting: "Endocrinology

## 2021-07-01 VITALS — BP 118/54 | HR 80 | Ht 66.0 in | Wt 153.8 lb

## 2021-07-01 DIAGNOSIS — E042 Nontoxic multinodular goiter: Secondary | ICD-10-CM

## 2021-07-01 NOTE — Progress Notes (Signed)
07/01/2021, 10:25 AM  Endocrinology follow-up note   Subjective:    Patient ID: Briana Koch, female    DOB: [birth year], PCP Aggie Cosier, MD   Past Medical History:  Diagnosis Date   Hypertension    History reviewed. No pertinent surgical history. Social History   Socioeconomic History   Marital status: Married    Spouse name: Not on file   Number of children: Not on file   Years of education: Not on file   Highest education level: Not on file  Occupational History   Not on file  Tobacco Use   Smoking status: Former    Types: Cigarettes    Quit date: 2019    Years since quitting: 3.8   Smokeless tobacco: Never   Tobacco comments:    Pt smoked 3 per week before quitting  Vaping Use   Vaping Use: Never used  Substance and Sexual Activity   Alcohol use: Not Currently   Drug use: Never   Sexual activity: Not on file  Other Topics Concern   Not on file  Social History Narrative   Not on file   Social Determinants of Health   Financial Resource Strain: Not on file  Food Insecurity: Not on file  Transportation Needs: Not on file  Physical Activity: Not on file  Stress: Not on file  Social Connections: Not on file   Outpatient Encounter Medications as of 07/01/2021  Medication Sig   amLODipine (NORVASC) 5 MG tablet Take 5 mg by mouth daily.   atorvastatin (LIPITOR) 10 MG tablet Take 10 mg by mouth daily.   losartan-hydrochlorothiazide (HYZAAR) 100-25 MG tablet Take 1 tablet by mouth daily.   pantoprazole (PROTONIX) 40 MG tablet Take 40 mg by mouth every morning.   [DISCONTINUED] amLODipine (NORVASC) 5 MG tablet Take 5 mg by mouth daily.   [DISCONTINUED] hydrochlorothiazide (HYDRODIURIL) 25 MG tablet Take 1 tablet by mouth daily.   [DISCONTINUED] losartan (COZAAR) 100 MG tablet Take 100 mg by mouth daily.   No facility-administered encounter medications on file as of 07/01/2021.   ALLERGIES: No  Known Allergies  VACCINATION STATUS:  There is no immunization history on file for this patient.  HPI Briana Koch is 80 y.o. female who is being seen in follow-up for euthyroid multinodular goiter.    -This patient was seen previously in this practice for multinodular goiter known since March 2015.   -She saw Dr. Renaee Munda in [place] for nodular goiter for several years.  -She is known to have long-standing nodular goiter with little change over time. Her most recent ultrasound prior to this visit on April 11, 2021 shows 5.6 cm right lobe with no dominant nodule, 7.8 cm left lobe with a dominant nodule measuring 3.4 x 3.3 cm. She also has a nodule in the isthmus measuring 2.9 cm progressively increasing from 2.3 cm.  -During her previous visits, she minimized compressive symptoms.  However, during her visit today she reports more pronounced difficulty breathing when she lays flat or when she lays on the left.  She denies voice change, but reports intermittent dysphagia with both liquids and solids. She is not on antithyroid medication nor thyroid hormone.  Her previsit thyroid function tests remain consistent  with euthyroid state.  She is a former heavy smoker.  She denies any family history of thyroid cancer.  -She did have biopsy remotely with benign findings. -She denies palpitations, tremors, nor heat/cold intolerance.   Review of Systems  Limited as above.  Objective:    BP (!) 118/54   Pulse 80   Ht 5\' 6"  (1.676 m)   Wt 153 lb 12.8 oz (69.8 kg)   BMI 24.82 kg/m   Wt Readings from Last 3 Encounters:  07/01/21 153 lb 12.8 oz (69.8 kg)  05/23/20 148 lb 12.8 oz (67.5 kg)  06/02/18 161 lb (73 kg)    Physical Exam    Most recent thyroid ultrasound October 10, 2019: Right lobe 5.5 x 2.5 x 2.6 cm.  Left lobe 7.3 x 3.8 x 4.6 cm.  Isthmus nodule 2.6 cm, previously 2.8 cm.  Right lobe nodule 2.2 cm previously 2.1 cm.  Left lobe nodule 4.6 cm, previously 4.7 cm.  In the  left lobe nodule 3.1cm, previously 3 cm.  Left lobe nodule 3 cm, previously 2.5 cm.   Thyroid ultrasound on March 17, 2017: Summary Right lobe 6.0 cm (previously 6 cm) with 2.3 cm nodule previously biopsied with benign findings, left lobe 7.3 cm with  nodules (previously 6.8 cm).  The left lobe extends below the clavicle. -There are multiple bilateral nodules, largest is on the right described above. Impression: No significant change since the prior study and re demonstration of thyroid thyromegaly and bilateral thyroid nodules largest in the isthmus as described above.  Results for SERA, HITSMAN (MRN Briana Koch) as of 11/09/2019 17:04  Ref. Range 06/09/2019 13:12  TSH Latest Ref Range: 0.450 - 4.500 uIU/mL 1.540  Triiodothyronine,Free,Serum Latest Ref Range: 2.0 - 4.4 pg/mL 3.1  T4,Free(Direct) Latest Ref Range: 0.82 - 1.77 ng/dL 08/09/2019   Recent Results (from the past 2160 hour(s))  T4, Free     Status: None   Collection Time: 06/11/21 10:20 AM  Result Value Ref Range   Free T4 1.34 0.82 - 1.77 ng/dL  TSH     Status: None   Collection Time: 06/11/21 10:20 AM  Result Value Ref Range   TSH 1.970 0.450 - 4.500 uIU/mL    April 11, 2021 thyroid/neck ultrasound Her most recent ultrasound prior to this visit on April 11, 2021 shows 5.6 cm right lobe with no dominant nodule, 7.8 cm left lobe with a dominant nodule measuring 3.4 x 3.3 cm. She also has a nodule in the isthmus measuring 2.9 cm progressively increasing from 2.3 cm.    Assessment & Plan:   1. Multinodular goiter with compressive symptoms  - I have reviewed her repeat thyroid ultrasound as well as thyroid labs.  This patient is known to have multinodular goiter for at least 6 years.  -Her presentation today is different with pronounced compressive symptoms.  She is open for surgical exploration.   -The dominant nodule in the right  isthmus was biopsied and known to be benign. -She will be sent to Dr. April 13, 2021 for  evaluation and treatment.   She will return with labs after her surgery   -She maintains euthyroid state, will not require prescription for thyroid hormone at this time.  - I advised her  to maintain close follow up with Darnell Level, MD for primary care needs.  I spent 21 minutes in the care of the patient today including review of labs from Thyroid Function, CMP, and other relevant labs ; imaging/biopsy records (current and previous  including abstractions from other facilities); face-to-face time discussing  her lab results and symptoms, medications doses, her options of short and long term treatment based on the latest standards of care / guidelines;   and documenting the encounter.  Briana Koch  participated in the discussions, expressed understanding, and voiced agreement with the above plans.  All questions were answered to her satisfaction. she is encouraged to contact clinic should she have any questions or concerns prior to her return visit.   Follow up plan: Return in about 5 weeks (around 08/05/2021) for F/U with Labs after Surgery.   Marquis Lunch, MD [facility] [address] [place], [place] [address] Phone: [phone]  Fax: [phone]     07/01/2021, 10:25 AM  This note was partially dictated with voice recognition software. Similar sounding words can be transcribed inadequately or may not  be corrected upon review.

## 2021-08-01 ENCOUNTER — Telehealth: Payer: Self-pay | Admitting: "Endocrinology

## 2021-08-01 NOTE — Telephone Encounter (Signed)
Patient has not heard from anyone regarding her surgery

## 2021-08-01 NOTE — Telephone Encounter (Signed)
Referral sent back. Fax confirmed. Will call later today to verify they received this and will call pt.

## 2021-08-01 NOTE — Telephone Encounter (Signed)
Spoke with Lequita Halt at [facility]. They will contact pt.

## 2021-08-05 ENCOUNTER — Ambulatory Visit: Payer: Medicare PPO | Admitting: "Endocrinology

## 2021-11-07 NOTE — Telephone Encounter (Signed)
Pt states she never heard from anyone about having surgery and still has not had it. Patient is wanting to be seen, please advise with pt ?

## 2021-11-07 NOTE — Telephone Encounter (Signed)
[facility] states that they have tried to contact pt several times with no call back from Mrs Blust. Gave Mrs Gage the # and she will call today to sched.  ?

## 2021-12-11 ENCOUNTER — Ambulatory Visit: Payer: Self-pay | Admitting: Surgery

## 2022-01-01 NOTE — Progress Notes (Addendum)
COVID Vaccine Completed: Yes x2 ?Date COVID Vaccine completed: ?Has received booster:  Yes x1 ?COVID vaccine manufacturer: [employer]   ? ?Date of COVID positive in last 90 days:  No ? ?PCP - Aggie Cosier, MD ?Cardiologist - N/A ? ?Chest x-ray - 01-02-22  Epic ?EKG - 01-02-22  Epic ?Stress Test - N/A ?ECHO - N/A ?Cardiac Cath - N/A ?Pacemaker/ICD device last checked: ?Spinal Cord Stimulator: ? ?Bowel Prep - N/A ? ?Sleep Study - N/A ?CPAP -  ? ?Fasting Blood Sugar - N/A ?Checks Blood Sugar _____ times a day ? ?Blood Thinner Instructions:N/A ?Aspirin Instructions: ?Last Dose: ? ?Activity level:   Can go up a flight of stairs and perform activities of daily living without stopping and without symptoms of chest pain or shortness of breath. ?   ?Anesthesia review: N/A ? ?Patient denies shortness of breath, fever, cough and chest pain at PAT appointment ? ? ?Patient verbalized understanding of instructions that were given to them at the PAT appointment. Patient was also instructed that they will need to review over the PAT instructions again at home before surgery.  ?

## 2022-01-01 NOTE — Patient Instructions (Addendum)
DUE TO COVID-19 ONLY TWO VISITORS  (aged 81 and older)  IS ALLOWED TO COME WITH YOU AND STAY IN THE WAITING ROOM ONLY DURING PRE OP AND PROCEDURE.   ?**NO VISITORS ARE ALLOWED IN THE SHORT STAY AREA OR RECOVERY ROOM!!** ? ?IF YOU WILL BE ADMITTED INTO THE HOSPITAL YOU ARE ALLOWED ONLY FOUR SUPPORT PEOPLE DURING VISITATION HOURS ONLY (7 AM -8PM)   ?The support person(s) must pass our screening, gel in and out ?Visitors GUEST BADGE MUST BE WORN VISIBLY  ?One adult visitor may remain with you overnight and MUST be in the room by 8 P.M.  ? ?You are not required to quarantine ?Hand Hygiene often ?Do NOT share personal items ?Notify your provider if you are in close contact with someone who has COVID or you develop fever 100.4 or greater, new onset of sneezing, cough, sore throat, shortness of breath or body aches. ? ?     ? Your procedure is scheduled on:  01-10-22 ? ? Report to [facility] Main Entrance ? ?  Report to admitting at 7:00 AM ? ? Call this number if you have problems the morning of surgery [phone] ? ? Do not eat food :After Midnight. ? ? After Midnight you may have the following liquids until 6:15 AM DAY OF SURGERY ? ?Water ?Black Coffee (sugar ok, NO MILK/CREAM OR CREAMERS)  ?Tea (sugar ok, NO MILK/CREAM OR CREAMERS) regular and decaf                             ?Plain Jell-O (NO RED)                                           ?Fruit ices (not with fruit pulp, NO RED)                                     ?Popsicles (NO RED)                                                                  ?Juice: apple, WHITE grape, WHITE cranberry ?Sports drinks like Gatorade (NO RED) ?Clear broth(vegetable,chicken,beef) ? ?              ?FOLLOW ANY ADDITIONAL PRE OP INSTRUCTIONS YOU RECEIVED FROM YOUR SURGEON'S OFFICE!!! ?  ?  ?Oral Hygiene is also important to reduce your risk of infection.                                    ?Remember - BRUSH YOUR TEETH THE MORNING OF SURGERY WITH YOUR REGULAR  TOOTHPASTE ? ? Do NOT smoke after Midnight ? ? Take these medicines the morning of surgery with A SIP OF WATER: Amlodipine, Atorvastatin, Pantoprazole ?                  ?           You may not have any metal on your body including  hair pins, jewelry, and body piercing ? ?           Do not wear make-up, lotions, powders, perfumes or deodorant ? ?Do not wear nail polish including gel and S&S, artificial/acrylic nails, or any other type of covering on natural nails including finger and toenails. If you have artificial nails, gel coating, etc. that needs to be removed by a nail salon please have this removed prior to surgery or surgery may need to be canceled/ delayed if the surgeon/ anesthesia feels like they are unable to be safely monitored.  ? ?Do not shave  48 hours prior to surgery.  ? ? Do not bring valuables to the hospital. [facility] IS NOT RESPONSIBLE   FOR VALUABLES. ? ? Contacts, dentures or bridgework may not be worn into surgery. ? ? Bring small overnight bag day of surgery. ? ?Please read over the following fact sheets you were given: IF YOU HAVE QUESTIONS ABOUT YOUR PRE-OP INSTRUCTIONS PLEASE CALL [phone] Gwen  ? ?[place] - Preparing for Surgery ?Before surgery, you can play an important role.  Because skin is not sterile, your skin needs to be as free of germs as possible.  You can reduce the number of germs on your skin by washing with CHG (chlorahexidine gluconate) soap before surgery.  CHG is an antiseptic cleaner which kills germs and bonds with the skin to continue killing germs even after washing. ?Please DO NOT use if you have an allergy to CHG or antibacterial soaps.  If your skin becomes reddened/irritated stop using the CHG and inform your nurse when you arrive at Short Stay. ?Do not shave (including legs and underarms) for at least 48 hours prior to the first CHG shower.  You may shave your face/neck. ? ?Please follow these instructions carefully: ? 1.  Shower with CHG Soap the  night before surgery and the  morning of surgery. ? 2.  If you choose to wash your hair, wash your hair first as usual with your normal  shampoo. ? 3.  After you shampoo, rinse your hair and body thoroughly to remove the shampoo.                            ? 4.  Use CHG as you would any other liquid soap.  You can apply chg directly to the skin and wash.  Gently with a scrungie or clean washcloth. ? 5.  Apply the CHG Soap to your body ONLY FROM THE NECK DOWN.   Do   not use on face/ open      ?                     Wound or open sores. Avoid contact with eyes, ears mouth and   genitals (private parts).  ?                     Engineering geologist,  Genitals (private parts) with your normal soap. ?            6.  Wash thoroughly, paying special attention to the area where your    surgery  will be performed. ? 7.  Thoroughly rinse your body with warm water from the neck down. ? 8.  DO NOT shower/wash with your normal soap after using and rinsing off the CHG Soap. ?  9.  Pat yourself dry with a clean towel. ?           10.  Wear clean pajamas. ?           11.  Place clean sheets on your bed the night of your first shower and do not  sleep with pets. ?Day of Surgery : ?Do not apply any lotions/deodorants the morning of surgery.  Please wear clean clothes to the hospital/surgery center. ? ?FAILURE TO FOLLOW THESE INSTRUCTIONS MAY RESULT IN THE CANCELLATION OF YOUR SURGERY ? ?PATIENT SIGNATURE_________________________________ ? ?NURSE SIGNATURE__________________________________ ? ?________________________________________________________________________  ?  ?

## 2022-01-02 ENCOUNTER — Encounter (HOSPITAL_COMMUNITY)
Admission: RE | Admit: 2022-01-02 | Discharge: 2022-01-02 | Disposition: A | Discharge status: Home / Self Care | Payer: Medicare PPO | Source: Ambulatory Visit | Attending: Surgery | Admitting: Surgery

## 2022-01-02 ENCOUNTER — Encounter (HOSPITAL_COMMUNITY): Payer: Self-pay | Admitting: *Deleted

## 2022-01-02 ENCOUNTER — Ambulatory Visit (HOSPITAL_COMMUNITY)
Admission: RE | Admit: 2022-01-02 | Discharge: 2022-01-02 | Disposition: A | Discharge status: Home / Self Care | Payer: Medicare PPO | Source: Ambulatory Visit | Attending: Anesthesiology | Admitting: Anesthesiology

## 2022-01-02 ENCOUNTER — Other Ambulatory Visit: Payer: Self-pay

## 2022-01-02 VITALS — BP 146/67 | HR 79 | Temp 98.2°F | Resp 20 | Ht 67.0 in | Wt 157.2 lb

## 2022-01-02 DIAGNOSIS — I2510 Atherosclerotic heart disease of native coronary artery without angina pectoris: Secondary | ICD-10-CM

## 2022-01-02 DIAGNOSIS — Z01818 Encounter for other preprocedural examination: Secondary | ICD-10-CM | POA: Insufficient documentation

## 2022-01-02 HISTORY — DX: Gastro-esophageal reflux disease without esophagitis: K21.9

## 2022-01-02 HISTORY — DX: Anemia, unspecified: D64.9

## 2022-01-02 HISTORY — DX: Pneumonia, unspecified organism: J18.9

## 2022-01-02 HISTORY — DX: Nontoxic multinodular goiter: E04.2

## 2022-01-02 LAB — CBC
HCT: 33.2 % — ABNORMAL LOW (ref 36.0–46.0)
Hemoglobin: 11.4 g/dL — ABNORMAL LOW (ref 12.0–15.0)
MCH: 27.1 pg (ref 26.0–34.0)
MCHC: 34.3 g/dL (ref 30.0–36.0)
MCV: 79 fL — ABNORMAL LOW (ref 80.0–100.0)
Platelets: 239 10*3/uL (ref 150–400)
RBC: 4.2 MIL/uL (ref 3.87–5.11)
RDW: 14.6 % (ref 11.5–15.5)
WBC: 5.5 10*3/uL (ref 4.0–10.5)
nRBC: 0 % (ref 0.0–0.2)

## 2022-01-02 LAB — BASIC METABOLIC PANEL
Anion gap: 4 — ABNORMAL LOW (ref 5–15)
BUN: 25 mg/dL — ABNORMAL HIGH (ref 8–23)
CO2: 28 mmol/L (ref 22–32)
Calcium: 9.4 mg/dL (ref 8.9–10.3)
Chloride: 108 mmol/L (ref 98–111)
Creatinine, Ser: 0.9 mg/dL (ref 0.44–1.00)
GFR, Estimated: >60 mL/min (ref >60)
Glucose, Bld: 101 mg/dL — ABNORMAL HIGH (ref 70–99)
Potassium: 3.7 mmol/L (ref 3.5–5.1)
Sodium: 140 mmol/L (ref 135–145)

## 2022-01-04 ENCOUNTER — Encounter (HOSPITAL_COMMUNITY): Payer: Self-pay | Admitting: Surgery

## 2022-01-04 NOTE — H&P (Signed)
? ? ? ?REFERRING PHYSICIAN: Aggie Cosier, MD ? ?PROVIDER: Anguel Delapena Myra Rude, MD ? ? ?Chief Complaint: New Consultation (Multinodular thyroid goiter with compressive symptoms) ? ? ?History of Present Illness: ? ?Patient is referred by Dr. Purcell Nails for surgical evaluation and management of multinodular thyroid goiter with compressive symptoms. Patient has a longstanding history of thyroid nodules. She has seen gradual enlargement over the years. She was originally followed in [place]. She has had multiple fine-needle aspiration biopsies performed, all of which returned as benign. In recent years the patient has noticed increased size of the left thyroid lobe and a large nodule in the thyroid isthmus. She states that she has dyspnea upon lying flat and therefore needs to sleep upright. She also notes increasing discomfort in the left neck. Patient has an ultrasound from August 2022 performed in [place]. This demonstrated a left thyroid lobe measuring 7.8 x 4.3 x 4.8 cm. The right lobe measured 5.6 x 2.5 x 2.6 cm. Dominant nodules were in the isthmus and left lobe. Patient has never been on thyroid medication. She has had no prior head or neck surgery. There is a history of goiter in the patient's daughter. There is no family history of thyroid cancer. Patient presents today to discuss thyroid surgery for relief of multinodular goiter with compressive symptoms. ? ?Review of Systems: ?A complete review of systems was obtained from the patient. I have reviewed this information and discussed as appropriate with the patient. See HPI as well for other ROS. ? ?Review of Systems  ?Constitutional: Negative.  ?HENT: Positive for sore throat.  ?Left neck pain  ?Eyes: Negative.  ?Respiratory: Positive for shortness of breath.  ?Cardiovascular: Negative.  ?Gastrointestinal: Negative.  ?Genitourinary: Negative.  ?Musculoskeletal: Negative.  ?Skin: Negative.  ?Neurological: Negative.   ?Endo/Heme/Allergies: Negative.  ?Psychiatric/Behavioral: Negative.  ? ? ?Medical History: ?Past Medical History:  ?Diagnosis Date  ? GERD (gastroesophageal reflux disease)  ? Hypertension  ? Thyroid disease  ? ?Patient Active Problem List  ?Diagnosis  ? Multiple thyroid nodules  ? Enlarged thyroid  ? ?History reviewed. No pertinent surgical history.  ? ?No Known Allergies ? ?Current Outpatient Medications on File Prior to Visit  ?Medication Sig Dispense Refill  ? amLODIPine (NORVASC) 5 MG tablet Take 5 mg by mouth once daily  ? atorvastatin (LIPITOR) 10 MG tablet Take 10 mg by mouth once daily  ? pantoprazole (PROTONIX) 40 MG DR tablet Take 40 mg by mouth every morning before breakfast (0630)  ? valsartan (DIOVAN) 320 MG tablet Take 320 mg by mouth once daily  ? ?No current facility-administered medications on file prior to visit.  ? ?Family History  ?Problem Relation Age of Onset  ? Stroke Father  ? High blood pressure (Hypertension) Father  ? Heart valve disease Father  ? High blood pressure (Hypertension) Sister  ? Stroke Brother  ? Heart valve disease Brother  ? Hyperlipidemia (Elevated cholesterol) Brother  ? High blood pressure (Hypertension) Brother  ? ? ?Social History  ? ?Tobacco Use  ?Smoking Status Former  ? Types: Cigarettes  ?Smokeless Tobacco Never  ? ? ?Social History  ? ?Socioeconomic History  ? Marital status: Married  ?Tobacco Use  ? Smoking status: Former  ?Types: Cigarettes  ? Smokeless tobacco: Never  ?Vaping Use  ? Vaping Use: Never used  ?Substance and Sexual Activity  ? Alcohol use: Yes  ? Drug use: Yes  ? ?Objective:  ? ?Vitals:  ?BP: 130/68  ?Pulse: 100  ?Temp: 36.5 ?C (97.7 ?  F)  ?SpO2: 99%  ?Weight: 73.1 kg (161 lb 3.2 oz)  ?Height: 170.2 cm (5\' 7" )  ? ?Body mass index is 25.25 kg/m?. ? ?Physical Exam  ? ?GENERAL APPEARANCE ?Comfortable, no acute issues ?Development: normal ?Gross deformities: none ? ?SKIN ?Rash, lesions, ulcers: none ?Induration, erythema: none ?Nodules: none  palpable ? ?EYES ?Conjunctiva and lids: normal ?Pupils: equal and reactive ? ?EARS, NOSE, MOUTH, THROAT ?External ears: no lesion or deformity ?External nose: no lesion or deformity ?Hearing: grossly normal ? ?NECK ?Symmetric: no ?Trachea: probable shift to right ?Thyroid: There is a dominant visible nodule in the thyroid isthmus measuring approximately 4 cm in diameter. This is smooth, slightly firm, mobile, and nontender. There is a dominant mass occupying a large portion of the left thyroid lobe. This is also smooth and mobile and nontender. The right lobe seems slightly nodular without discrete or dominant mass. There is no associated lymphadenopathy. ? ?CHEST ?Respiratory effort: normal ?Retraction or accessory muscle use: no ?Breath sounds: normal bilaterally ?Rales, rhonchi, wheeze: none ? ?CARDIOVASCULAR ?Auscultation: regular rhythm, normal rate ?Murmurs: none ?Pulses: radial pulse 2+ palpable ?Lower extremity edema: none ? ?ABDOMEN ?Not assessed ? ?GENITOURINARY ?Not assessed ? ?MUSCULOSKELETAL ?Station and gait: normal ?Digits and nails: no clubbing or cyanosis ?Muscle strength: grossly normal all extremities ?Range of motion: grossly normal all extremities ?Deformity: none ? ?LYMPHATIC ?Cervical: none palpable ?Supraclavicular: none palpable ? ?PSYCHIATRIC ?Oriented to person, place, and time: yes ?Mood and affect: normal for situation ?Judgment and insight: appropriate for situation ? ? ? ?Assessment and Plan:  ? ?Multiple thyroid nodules ? ?Enlarged thyroid ? ?Patient is referred by Dr. for surgical evaluation and management of multinodular thyroid goiter with compressive symptoms. ? ?Patient provided with a copy of "The Thyroid Book: Medical and Surgical Treatment of Thyroid Problems", published by [employer], 16 pages. Book reviewed and explained to patient during visit today. ? ?The patient and I reviewed her ultrasound results from [place]. We discussed her clinical  history. We reviewed the written literature with which I provided her today regarding thyroid surgery. ? ?Patient has a multinodular goiter with dominant masses in the left thyroid lobe and isthmus. She may have a degree of tracheal deviation. She has developed compressive symptoms. Today we discussed proceeding with left thyroid lobectomy and isthmusectomy versus total thyroidectomy. I would like to performed just the lobectomy if possible but this may be a decision that will need to be made intraoperatively. We discussed the procedure. We discussed the size and location of the surgical incision. We discussed potential complications including recurrent laryngeal nerve injury and injury to parathyroid glands. We discussed the hospital stay to be anticipated. We discussed the potential need for lifelong thyroid hormone replacement therapy. The patient understands and wishes to proceed with surgery in the near future. ? ?The risks and benefits of the procedure have been discussed at length with the patient. The patient understands the proposed procedure, potential alternative treatments, and the course of recovery to be expected. All of the patient's questions have been answered at this time. The patient wishes to proceed with surgery.  ? ?[place], MD ?[facility] ?A [facility] practice ?Office: [phone] ? ?

## 2022-01-05 NOTE — Progress Notes (Signed)
CXR reviewed. ? ?tmg ? ?Armandina Gemma, MD ?[facility] ?A [facility] practice ?Office: [phone] ?

## 2022-01-10 ENCOUNTER — Ambulatory Visit (HOSPITAL_COMMUNITY)
Admission: RE | Admit: 2022-01-10 | Discharge: 2022-01-11 | Disposition: A | Discharge status: Home / Self Care | Payer: Medicare PPO | Source: Ambulatory Visit | Attending: Surgery | Admitting: Surgery

## 2022-01-10 ENCOUNTER — Other Ambulatory Visit: Payer: Self-pay

## 2022-01-10 ENCOUNTER — Encounter (HOSPITAL_COMMUNITY): Payer: Self-pay | Admitting: Surgery

## 2022-01-10 ENCOUNTER — Ambulatory Visit (HOSPITAL_BASED_OUTPATIENT_CLINIC_OR_DEPARTMENT_OTHER): Payer: Medicare PPO | Admitting: Certified Registered Nurse Anesthetist

## 2022-01-10 ENCOUNTER — Encounter (HOSPITAL_COMMUNITY)
Admission: RE | Disposition: A | Discharge status: Home / Self Care | Payer: Self-pay | Source: Ambulatory Visit | Attending: Surgery

## 2022-01-10 ENCOUNTER — Ambulatory Visit (HOSPITAL_COMMUNITY): Payer: Medicare PPO | Admitting: Anesthesiology

## 2022-01-10 DIAGNOSIS — K219 Gastro-esophageal reflux disease without esophagitis: Secondary | ICD-10-CM | POA: Insufficient documentation

## 2022-01-10 DIAGNOSIS — I10 Essential (primary) hypertension: Secondary | ICD-10-CM | POA: Insufficient documentation

## 2022-01-10 DIAGNOSIS — E042 Nontoxic multinodular goiter: Secondary | ICD-10-CM | POA: Diagnosis not present

## 2022-01-10 DIAGNOSIS — Z79899 Other long term (current) drug therapy: Secondary | ICD-10-CM | POA: Insufficient documentation

## 2022-01-10 DIAGNOSIS — Z8349 Family history of other endocrine, nutritional and metabolic diseases: Secondary | ICD-10-CM | POA: Insufficient documentation

## 2022-01-10 DIAGNOSIS — Z87891 Personal history of nicotine dependence: Secondary | ICD-10-CM | POA: Diagnosis not present

## 2022-01-10 DIAGNOSIS — E065 Other chronic thyroiditis: Secondary | ICD-10-CM | POA: Diagnosis not present

## 2022-01-10 HISTORY — PX: THYROID LOBECTOMY: SHX420

## 2022-01-10 SURGERY — LOBECTOMY, THYROID
Anesthesia: General | Site: Neck | Laterality: Left

## 2022-01-10 MED ORDER — HYDROMORPHONE HCL 1 MG/ML IJ SOLN: 1.0000 mg | INTRAMUSCULAR | Status: DC | PRN

## 2022-01-10 MED ORDER — OXYCODONE HCL 5 MG PO TABS: 5.0000 mg | ORAL_TABLET | Freq: Once | ORAL | Status: DC | PRN

## 2022-01-10 MED ORDER — FENTANYL CITRATE (PF) 250 MCG/5ML IJ SOLN
INTRAMUSCULAR | Status: DC | PRN
  Administered 2022-01-10: 25 ug via INTRAVENOUS
  Administered 2022-01-10 (×3): 50 ug via INTRAVENOUS
  Administered 2022-01-10: 25 ug via INTRAVENOUS

## 2022-01-10 MED ORDER — OXYCODONE HCL 5 MG/5ML PO SOLN: 5.0000 mg | Freq: Once | ORAL | Status: DC | PRN

## 2022-01-10 MED ORDER — ACETAMINOPHEN 10 MG/ML IV SOLN
1000.0000 mg | Freq: Once | INTRAVENOUS | Status: DC | PRN
  Administered 2022-01-10: 1000 mg via INTRAVENOUS

## 2022-01-10 MED ORDER — SUCCINYLCHOLINE CHLORIDE 200 MG/10ML IV SOSY
PREFILLED_SYRINGE | INTRAVENOUS | Status: DC | PRN
  Administered 2022-01-10: 120 mg via INTRAVENOUS

## 2022-01-10 MED ORDER — LIDOCAINE HCL (PF) 2 % IJ SOLN
INTRAMUSCULAR | Status: AC
  Filled 2022-01-10: qty 5

## 2022-01-10 MED ORDER — 0.9 % SODIUM CHLORIDE (POUR BTL) OPTIME
TOPICAL | Status: DC | PRN
Start: 2022-01-10 — End: 2022-01-10
  Administered 2022-01-10: 1000 mL

## 2022-01-10 MED ORDER — PHENYLEPHRINE 80 MCG/ML (10ML) SYRINGE FOR IV PUSH (FOR BLOOD PRESSURE SUPPORT)
PREFILLED_SYRINGE | INTRAVENOUS | Status: AC
  Filled 2022-01-10: qty 10

## 2022-01-10 MED ORDER — ACETAMINOPHEN 160 MG/5ML PO SOLN: 325.0000 mg | ORAL | Status: DC | PRN

## 2022-01-10 MED ORDER — ONDANSETRON HCL 4 MG/2ML IJ SOLN: 4.0000 mg | Freq: Four times a day (QID) | INTRAMUSCULAR | Status: DC | PRN

## 2022-01-10 MED ORDER — AMISULPRIDE (ANTIEMETIC) 5 MG/2ML IV SOLN: 10.0000 mg | Freq: Once | INTRAVENOUS | Status: DC | PRN

## 2022-01-10 MED ORDER — PANTOPRAZOLE SODIUM 40 MG PO TBEC
40.0000 mg | DELAYED_RELEASE_TABLET | ORAL | Status: DC
Start: 2022-01-11 — End: 2022-01-11
  Administered 2022-01-11: 40 mg via ORAL
  Filled 2022-01-10: qty 1

## 2022-01-10 MED ORDER — SUGAMMADEX SODIUM 200 MG/2ML IV SOLN
INTRAVENOUS | Status: DC | PRN
  Administered 2022-01-10: 150 mg via INTRAVENOUS

## 2022-01-10 MED ORDER — ONDANSETRON HCL 4 MG/2ML IJ SOLN
INTRAMUSCULAR | Status: AC
  Filled 2022-01-10: qty 2

## 2022-01-10 MED ORDER — ACETAMINOPHEN 650 MG RE SUPP: 650.0000 mg | Freq: Four times a day (QID) | RECTAL | Status: DC | PRN

## 2022-01-10 MED ORDER — CHLORHEXIDINE GLUCONATE 0.12 % MT SOLN
15.0000 mL | Freq: Once | OROMUCOSAL | Status: AC
  Administered 2022-01-10: 15 mL via OROMUCOSAL

## 2022-01-10 MED ORDER — FENTANYL CITRATE PF 50 MCG/ML IJ SOSY
25.0000 ug | PREFILLED_SYRINGE | INTRAMUSCULAR | Status: DC | PRN
  Administered 2022-01-10 (×3): 25 ug via INTRAVENOUS

## 2022-01-10 MED ORDER — LACTATED RINGERS IV SOLN: INTRAVENOUS | Status: DC

## 2022-01-10 MED ORDER — AMLODIPINE BESYLATE 5 MG PO TABS
5.0000 mg | ORAL_TABLET | Freq: Every day | ORAL | Status: DC
  Administered 2022-01-11: 5 mg via ORAL
  Filled 2022-01-10: qty 1

## 2022-01-10 MED ORDER — SODIUM CHLORIDE 0.45 % IV SOLN: INTRAVENOUS | Status: DC

## 2022-01-10 MED ORDER — ONDANSETRON HCL 4 MG/2ML IJ SOLN
INTRAMUSCULAR | Status: DC | PRN
Start: 2022-01-10 — End: 2022-01-10
  Administered 2022-01-10: 4 mg via INTRAVENOUS

## 2022-01-10 MED ORDER — HYDROCHLOROTHIAZIDE 25 MG PO TABS
25.0000 mg | ORAL_TABLET | Freq: Every day | ORAL | Status: DC
  Administered 2022-01-10 – 2022-01-11 (×2): 25 mg via ORAL
  Filled 2022-01-10 (×2): qty 1

## 2022-01-10 MED ORDER — ROCURONIUM BROMIDE 10 MG/ML (PF) SYRINGE
PREFILLED_SYRINGE | INTRAVENOUS | Status: AC
  Filled 2022-01-10: qty 10

## 2022-01-10 MED ORDER — ONDANSETRON 4 MG PO TBDP: 4.0000 mg | ORAL_TABLET | Freq: Four times a day (QID) | ORAL | Status: DC | PRN

## 2022-01-10 MED ORDER — CEFAZOLIN SODIUM-DEXTROSE 2-4 GM/100ML-% IV SOLN
2.0000 g | INTRAVENOUS | Status: AC
  Administered 2022-01-10: 2 g via INTRAVENOUS
  Filled 2022-01-10: qty 100

## 2022-01-10 MED ORDER — ACETAMINOPHEN 10 MG/ML IV SOLN
INTRAVENOUS | Status: AC
Start: 2022-01-10 — End: 2022-01-10
  Filled 2022-01-10: qty 100

## 2022-01-10 MED ORDER — VALSARTAN-HYDROCHLOROTHIAZIDE 320-25 MG PO TABS: 1.0000 | ORAL_TABLET | Freq: Every day | ORAL | Status: DC

## 2022-01-10 MED ORDER — PROPOFOL 10 MG/ML IV BOLUS
INTRAVENOUS | Status: DC | PRN
  Administered 2022-01-10: 130 mg via INTRAVENOUS
  Administered 2022-01-10: 20 mg via INTRAVENOUS

## 2022-01-10 MED ORDER — CHLORHEXIDINE GLUCONATE CLOTH 2 % EX PADS: 6.0000 | MEDICATED_PAD | Freq: Once | CUTANEOUS | Status: DC

## 2022-01-10 MED ORDER — ORAL CARE MOUTH RINSE
15.0000 mL | Freq: Once | OROMUCOSAL | Status: AC
Start: 2022-01-10 — End: 2022-01-10

## 2022-01-10 MED ORDER — LIDOCAINE HCL (CARDIAC) PF 100 MG/5ML IV SOSY
PREFILLED_SYRINGE | INTRAVENOUS | Status: DC | PRN
  Administered 2022-01-10: 60 mg via INTRAVENOUS

## 2022-01-10 MED ORDER — TRAMADOL HCL 50 MG PO TABS: 50.0000 mg | ORAL_TABLET | Freq: Four times a day (QID) | ORAL | 0 refills | Status: AC | PRN

## 2022-01-10 MED ORDER — DEXAMETHASONE SODIUM PHOSPHATE 10 MG/ML IJ SOLN
INTRAMUSCULAR | Status: DC | PRN
  Administered 2022-01-10: 5 mg via INTRAVENOUS

## 2022-01-10 MED ORDER — ACETAMINOPHEN 325 MG PO TABS: 325.0000 mg | ORAL_TABLET | ORAL | Status: DC | PRN

## 2022-01-10 MED ORDER — DEXAMETHASONE SODIUM PHOSPHATE 10 MG/ML IJ SOLN
INTRAMUSCULAR | Status: AC
  Filled 2022-01-10: qty 1

## 2022-01-10 MED ORDER — IRBESARTAN 150 MG PO TABS
300.0000 mg | ORAL_TABLET | Freq: Every day | ORAL | Status: DC
  Administered 2022-01-11: 300 mg via ORAL
  Filled 2022-01-10: qty 2

## 2022-01-10 MED ORDER — PHENYLEPHRINE 80 MCG/ML (10ML) SYRINGE FOR IV PUSH (FOR BLOOD PRESSURE SUPPORT)
PREFILLED_SYRINGE | INTRAVENOUS | Status: DC | PRN
  Administered 2022-01-10: 160 ug via INTRAVENOUS
  Administered 2022-01-10 (×2): 80 ug via INTRAVENOUS

## 2022-01-10 MED ORDER — TRAMADOL HCL 50 MG PO TABS
50.0000 mg | ORAL_TABLET | Freq: Four times a day (QID) | ORAL | Status: DC | PRN
  Administered 2022-01-10 – 2022-01-11 (×2): 50 mg via ORAL
  Filled 2022-01-10 (×2): qty 1

## 2022-01-10 MED ORDER — SUCCINYLCHOLINE CHLORIDE 200 MG/10ML IV SOSY
PREFILLED_SYRINGE | INTRAVENOUS | Status: AC
  Filled 2022-01-10: qty 10

## 2022-01-10 MED ORDER — FENTANYL CITRATE PF 50 MCG/ML IJ SOSY
PREFILLED_SYRINGE | INTRAMUSCULAR | Status: AC
  Filled 2022-01-10: qty 2

## 2022-01-10 MED ORDER — PROPOFOL 10 MG/ML IV BOLUS
INTRAVENOUS | Status: AC
Start: 2022-01-10 — End: ?
  Filled 2022-01-10: qty 20

## 2022-01-10 MED ORDER — ACETAMINOPHEN 325 MG PO TABS
650.0000 mg | ORAL_TABLET | Freq: Four times a day (QID) | ORAL | Status: DC | PRN
  Administered 2022-01-10: 650 mg via ORAL
  Filled 2022-01-10: qty 2

## 2022-01-10 MED ORDER — OXYCODONE HCL 5 MG PO TABS: 5.0000 mg | ORAL_TABLET | ORAL | Status: DC | PRN

## 2022-01-10 MED ORDER — HEMOSTATIC AGENTS (NO CHARGE) OPTIME
TOPICAL | Status: DC | PRN
  Administered 2022-01-10: 1 via TOPICAL

## 2022-01-10 MED ORDER — ROCURONIUM BROMIDE 10 MG/ML (PF) SYRINGE
PREFILLED_SYRINGE | INTRAVENOUS | Status: DC | PRN
  Administered 2022-01-10: 50 mg via INTRAVENOUS

## 2022-01-10 MED ORDER — FENTANYL CITRATE (PF) 250 MCG/5ML IJ SOLN
INTRAMUSCULAR | Status: AC
  Filled 2022-01-10: qty 5

## 2022-01-10 SURGICAL SUPPLY — 35 items
ATTRACTOMAT 16X20 MAGNETIC DRP (DRAPES) ×2 IMPLANT
BAG COUNTER SPONGE SURGICOUNT (BAG) ×2 IMPLANT
BLADE SURG 15 STRL LF DISP TIS (BLADE) ×1 IMPLANT
BLADE SURG 15 STRL SS (BLADE) ×1
CHLORAPREP W/TINT 26 (MISCELLANEOUS) ×2 IMPLANT
CLIP TI MEDIUM 6 (CLIP) ×5 IMPLANT
CLIP TI WIDE RED SMALL 6 (CLIP) ×4 IMPLANT
COVER SURGICAL LIGHT HANDLE (MISCELLANEOUS) ×2 IMPLANT
DERMABOND ADVANCED (GAUZE/BANDAGES/DRESSINGS) ×1
DERMABOND ADVANCED .7 DNX12 (GAUZE/BANDAGES/DRESSINGS) ×1 IMPLANT
DRAPE LAPAROTOMY T 98X78 PEDS (DRAPES) ×2 IMPLANT
DRAPE UTILITY XL STRL (DRAPES) ×2 IMPLANT
ELECT PENCIL ROCKER SW 15FT (MISCELLANEOUS) ×2 IMPLANT
ELECT REM PT RETURN 15FT ADLT (MISCELLANEOUS) ×2 IMPLANT
GAUZE 4X4 16PLY ~~LOC~~+RFID DBL (SPONGE) ×2 IMPLANT
GLOVE SURG ORTHO 8.0 STRL STRW (GLOVE) ×2 IMPLANT
GLOVE SURG SYN 7.5  E (GLOVE) ×3
GLOVE SURG SYN 7.5 E (GLOVE) ×3 IMPLANT
GLOVE SURG SYN 7.5 PF PI (GLOVE) ×3 IMPLANT
GOWN STRL REUS W/ TWL XL LVL3 (GOWN DISPOSABLE) ×2 IMPLANT
GOWN STRL REUS W/TWL XL LVL3 (GOWN DISPOSABLE) ×2
HEMOSTAT SURGICEL 2X4 FIBR (HEMOSTASIS) ×2 IMPLANT
ILLUMINATOR WAVEGUIDE N/F (MISCELLANEOUS) ×2 IMPLANT
KIT BASIN OR (CUSTOM PROCEDURE TRAY) ×2 IMPLANT
KIT TURNOVER KIT A (KITS) ×1 IMPLANT
PACK BASIC VI WITH GOWN DISP (CUSTOM PROCEDURE TRAY) ×2 IMPLANT
SHEARS HARMONIC 9CM CVD (BLADE) ×2 IMPLANT
SUT MNCRL AB 4-0 PS2 18 (SUTURE) ×2 IMPLANT
SUT SILK 2 0 (SUTURE) ×1
SUT SILK 2-0 18XBRD TIE 12 (SUTURE) IMPLANT
SUT VIC AB 3-0 SH 18 (SUTURE) ×4 IMPLANT
SYR BULB IRRIG 60ML STRL (SYRINGE) ×2 IMPLANT
TOWEL OR 17X26 10 PK STRL BLUE (TOWEL DISPOSABLE) ×2 IMPLANT
TOWEL OR NON WOVEN STRL DISP B (DISPOSABLE) ×2 IMPLANT
TUBING CONNECTING 10 (TUBING) ×2 IMPLANT

## 2022-01-10 NOTE — Anesthesia Procedure Notes (Signed)
Procedure Name: Intubation ?Date/Time: 01/10/2022 10:07 AM ?Performed by: Yolonda Kida, CRNA ?Pre-anesthesia Checklist: Patient identified, Emergency Drugs available, Suction available and Patient being monitored ?Patient Re-evaluated:Patient Re-evaluated prior to induction ?Oxygen Delivery Method: Circle system utilized ?Preoxygenation: Pre-oxygenation with 100% oxygen ?Induction Type: IV induction and Rapid sequence ?Ventilation: Mask ventilation without difficulty ?Laryngoscope Size: Hyacinth Meeker and 2 ?Grade View: Grade I ?Tube type: Oral ?Tube size: 7.0 mm ?Number of attempts: 1 ?Airway Equipment and Method: Stylet ?Placement Confirmation: ETT inserted through vocal cords under direct vision, positive ETCO2 and breath sounds checked- equal and bilateral ?Secured at: 22 cm ?Tube secured with: Tape ?Dental Injury: Teeth and Oropharynx as per pre-operative assessment  ? ? ? ? ?

## 2022-01-10 NOTE — Op Note (Signed)
Procedure Note ? ?Pre-operative Diagnosis:  Multinodular thyroid goiter ? ?Post-operative Diagnosis:  same ? ?Surgeon:  Darnell Level, MD ? ?Assistant:  none  ? ?Procedure:  Left thyroid lobectomy and isthmusectomy ? ?Anesthesia:  General ? ?Estimated Blood Loss:  minimal ? ?Drains: none ?        ?Specimen: thyroid lobe to pathology ? ?Indications:  Patient is referred by Dr. Purcell Nails for surgical evaluation and management of multinodular thyroid goiter with compressive symptoms. Patient has a longstanding history of thyroid nodules. She has seen gradual enlargement over the years. She was originally followed in [place]. She has had multiple fine-needle aspiration biopsies performed, all of which returned as benign. In recent years the patient has noticed increased size of the left thyroid lobe and a large nodule in the thyroid isthmus. She states that she has dyspnea upon lying flat and therefore needs to sleep upright. She also notes increasing discomfort in the left neck. Patient has an ultrasound from August 2022 performed in [place]. This demonstrated a left thyroid lobe measuring 7.8 x 4.3 x 4.8 cm. The right lobe measured 5.6 x 2.5 x 2.6 cm. Dominant nodules were in the isthmus and left lobe. Patient has never been on thyroid medication. She has had no prior head or neck surgery. There is a history of goiter in the patient's daughter. There is no family history of thyroid cancer. Patient presents today to discuss thyroid surgery for relief of multinodular goiter with compressive symptoms. ? ?Procedure Details: Procedure was done in OR #1 at the [facility]. The patient was brought to the operating room and placed in a supine position on the operating room table. Following administration of general anesthesia, the patient was positioned and then prepped and draped in the usual aseptic fashion. After ascertaining that an adequate level of anesthesia had been achieved, a  small Kocher incision was made with #15 blade. Dissection was carried through subcutaneous tissues and platysma. Hemostasis was achieved with the electrocautery. Skin flaps were elevated cephalad and caudad from the thyroid notch to the sternal notch. A self-retaining retractor was placed for exposure. Strap muscles were incised in the midline and dissection was begun on the left side. Strap muscles were reflected laterally. The left thyroid lobe was markedly enlarged with a central dominant mass.  There was a large dominant nodule in the mid isthmus as well. The lobe was gently mobilized with blunt dissection. Superior pole vessels were dissected out and divided individually between small and medium ligaclips with the harmonic scalpel. The thyroid lobe was rolled anteriorly. Branches of the inferior thyroid artery were divided between small ligaclips with the harmonic scalpel. Inferior venous tributaries were divided between ligaclips. Both the superior and inferior parathyroid glands were identified and preserved on their vascular pedicles. The recurrent laryngeal nerve was identified and preserved along its course. The ligament of Allyson Sabal was released with the electrocautery and the gland was mobilized onto the anterior trachea. Isthmus was mobilized across the midline so as to include the dominant nodule in the resection margin. There was a moderate sized pyramidal lobe present which was resected with the isthmus. The thyroid parenchyma was transected at the junction of the isthmus and contralateral thyroid lobe with the harmonic scalpel. The thyroid lobe and isthmus were submitted to pathology for review. ? ?The neck was irrigated with warm saline. Fibrillar was placed throughout the operative field. Strap muscles were approximated in the midline with interrupted 3-0 Vicryl sutures. Platysma was closed with  interrupted 3-0 Vicryl sutures. Skin was closed with a running 4-0 Monocryl subcuticular suture.  Wound was  washed and dried and Dermabond was applied. The patient was awakened from anesthesia and brought to the recovery room. The patient tolerated the procedure well. ? ? ?Darnell Level, MD ?[facility] ?Office: [phone]  ?

## 2022-01-10 NOTE — Plan of Care (Signed)
?  Problem: Education: ?Goal: Knowledge of the prescribed therapeutic regimen will improve ?Outcome: Progressing ?  ?Problem: Clinical Measurements: ?Goal: Complications related to the disease process, condition or treatment will be avoided or minimized ?Outcome: Progressing ?  ?Problem: Respiratory: ?Goal: Ability to maintain a clear airway will improve ?Outcome: Progressing ?  ?Problem: Education: ?Goal: Knowledge of General Education information will improve ?Description: Including pain rating scale, medication(s)/side effects and non-pharmacologic comfort measures ?Outcome: Progressing ?  ?Problem: Health Behavior/Discharge Planning: ?Goal: Ability to manage health-related needs will improve ?Outcome: Progressing ?  ?Problem: Clinical Measurements: ?Goal: Ability to maintain clinical measurements within normal limits will improve ?Outcome: Progressing ?Goal: Will remain free from infection ?Outcome: Progressing ?Goal: Diagnostic test results will improve ?Outcome: Progressing ?Goal: Respiratory complications will improve ?Outcome: Progressing ?Goal: Cardiovascular complication will be avoided ?Outcome: Progressing ?  ?Problem: Activity: ?Goal: Risk for activity intolerance will decrease ?Outcome: Progressing ?  ?Problem: Nutrition: ?Goal: Adequate nutrition will be maintained ?Outcome: Progressing ?  ?Problem: Coping: ?Goal: Level of anxiety will decrease ?Outcome: Progressing ?  ?Problem: Elimination: ?Goal: Will not experience complications related to bowel motility ?Outcome: Progressing ?Goal: Will not experience complications related to urinary retention ?Outcome: Progressing ?  ?Problem: Pain Managment: ?Goal: General experience of comfort will improve ?Outcome: Progressing ?  ?Problem: Safety: ?Goal: Ability to remain free from injury will improve ?Outcome: Progressing ?  ?Problem: Skin Integrity: ?Goal: Risk for impaired skin integrity will decrease ?Outcome: Progressing ?  ?

## 2022-01-10 NOTE — Discharge Instructions (Signed)
[facility] - Dr. Alacia Rehmann  THYROID & PARATHYROID SURGERY:  POST-OP INSTRUCTIONS  Always review the instruction sheet provided by the hospital nurse at discharge.  A prescription for pain medication may be sent to your pharmacy at the time of discharge.  Take your pain medication as prescribed.  If narcotic pain medicine is not needed, then you may take acetaminophen (Tylenol) or ibuprofen (Advil) as needed for pain or soreness.  Take your normal home medications as prescribed unless otherwise directed.  If you need a refill on your pain medication, please contact the office during regular business hours.  Prescriptions will not be processed by the office after 5:00PM or on weekends.  Start with a light diet upon arrival home, such as soup and crackers or toast.  Be sure to drink plenty of fluids.  Resume your normal diet the day after surgery.  Most patients will experience some swelling and bruising on the chest and neck area.  Ice packs will help for the first 48 hours after arriving home.  Swelling and bruising will take several days to resolve.   It is common to experience some constipation after surgery.  Increasing fluid intake and taking a stool softener (Colace) will usually help to prevent this problem.  A mild laxative (Milk of Magnesia or Miralax) should be taken according to package directions if there has been no bowel movement after 48 hours.  Dermabond glue covers your incision. This seals the wound and you may shower at any time. The Dermabond will remain in place for about a week.  You may gradually remove the glue when it loosens around the edges.  If you need to loosen the Dermabond for removal, apply a layer of Vaseline to the wound for 15 minutes and then remove with a Kleenex. Your sutures are under the skin and will not show - they will dissolve on their own.  You may resume light daily activities beginning the day after discharge (such as self-care,  walking, climbing stairs), gradually increasing activities as tolerated. You may have sexual intercourse when it is comfortable. Refrain from any heavy lifting or straining until approved by your doctor. You may drive when you no longer are taking prescription pain medication, you can comfortably wear a seatbelt, and you can safely maneuver your car and apply the brakes.  You will see your doctor in the office for a follow-up appointment approximately three weeks after your surgery.  Make sure that you call for this appointment within a day or two after you arrive home to insure a convenient appointment time. Please have any requested laboratory tests performed a few days prior to your office visit so that the results will be available at your follow up appointment.  WHEN TO CALL THE [facility] OFFICE: -- Fever greater than 101.5 -- Inability to urinate -- Nausea and/or vomiting - persistent -- Extreme swelling or bruising -- Continued bleeding from incision -- Increased pain, redness, or drainage from the incision -- Difficulty swallowing or breathing -- Muscle cramping or spasms -- Numbness or tingling in hands or around lips  The clinic staff is available to answer your questions during regular business hours.  Please don't hesitate to call and ask to speak to one of the nurses if you have concerns.  [facility] OFFICE: [phone] (24 hours)  Please sign up for MyChart accounts. This will allow you to communicate directly with my nurse or myself without having to call the office. It will also allow you   to view your test results. You will need to enroll in MyChart for my office ([facility]) and for the hospital ([facility]).  Cela Newcom, MD [facility] A [facility] practice 

## 2022-01-10 NOTE — Interval H&P Note (Signed)
History and Physical Interval Note: ? ?01/10/2022 ?9:52 AM ? ?Briana Koch  has presented today for surgery, with the diagnosis of MULTINODULAR GOITER WITH COMPRESSIVE SYMPTOMS.  The various methods of treatment have been discussed with the patient and family. After consideration of risks, benefits and other options for treatment, the patient has consented to  ? ? Procedure(s): ?LEFT THYROID LOBECTOMY, POSSIBLE TOTAL THYROIDECTOMY (Left) as a surgical intervention.   ? ?The patient's history has been reviewed, patient examined, no change in status, stable for surgery.  I have reviewed the patient's chart and labs.  Questions were answered to the patient's satisfaction.   ? ?Darnell Level, MD ?[facility] ?A [facility] practice ?Office: [phone] ? ? ?Darnell Level ? ? ?

## 2022-01-10 NOTE — Anesthesia Postprocedure Evaluation (Signed)
Anesthesia Post Note ? ?Patient: Briana Koch ? ?Procedure(s) Performed: LEFT THYROID LOBECTOMY (Left: Neck) ? ?  ? ?Patient location during evaluation: PACU ?Anesthesia Type: General ?Level of consciousness: awake and alert ?Pain management: pain level controlled ?Vital Signs Assessment: post-procedure vital signs reviewed and stable ?Respiratory status: spontaneous breathing, nonlabored ventilation, respiratory function stable and patient connected to nasal cannula oxygen ?Cardiovascular status: blood pressure returned to baseline and stable ?Postop Assessment: no apparent nausea or vomiting ?Anesthetic complications: no ? ? ?No notable events documented. ? ?Last Vitals:  ?Vitals:  ? 01/10/22 1416 01/10/22 1520  ?BP: (!) 142/66 (!) 149/70  ?Pulse: 82 90  ?Resp: 16 18  ?Temp: 36.6 ?C (!) 36.4 ?C  ?SpO2: 97% 99%  ?  ?Last Pain:  ?Vitals:  ? 01/10/22 1520  ?TempSrc: Oral  ?PainSc:   ? ? ?  ?  ?  ?  ?  ?  ? ?Effie Berkshire ? ? ? ? ?

## 2022-01-10 NOTE — Progress Notes (Signed)
?  Transition of Care (TOC) Screening Note ? ? ?Patient Details  ?Name: Briana Koch ?Date of Birth: 08/21/1941 ? ? ?Transition of Care (TOC) CM/SW Contact:    ?Braison Snoke, LCSW ?Phone Number: ?01/10/2022, 3:20 PM ? ? ? ?Transition of Care Department [facility]) has reviewed patient and no TOC needs have been identified at this time. We will continue to monitor patient advancement through interdisciplinary progression rounds. If new patient transition needs arise, please place a TOC consult. ? ? ?

## 2022-01-10 NOTE — Transfer of Care (Signed)
Immediate Anesthesia Transfer of Care Note ? ?Patient: Briana Koch ? ?Procedure(s) Performed: LEFT THYROID LOBECTOMY (Left: Neck) ? ?Patient Location: PACU ? ?Anesthesia Type:General ? ?Level of Consciousness: awake, drowsy and patient cooperative ? ?Airway & Oxygen Therapy: Patient Spontanous Breathing and Patient connected to face mask oxygen ? ?Post-op Assessment: Report given to RN and Post -op Vital signs reviewed and stable ? ?Post vital signs: Reviewed and stable ? ?Last Vitals:  ?Vitals Value Taken Time  ?BP 150/77 1138  ?Temp    ?Pulse 90   ?Resp 21   ?SpO2 96%   ? ? ?Last Pain:  ?Vitals:  ? 01/10/22 0728  ?TempSrc: Oral  ?PainSc: 0-No pain  ?   ? ?  ? ?Complications: No notable events documented. ?

## 2022-01-10 NOTE — Anesthesia Preprocedure Evaluation (Signed)
Anesthesia Evaluation  ? ? ?Reviewed: ?Allergy & Precautions, Patient's Chart, lab work & pertinent test results ? ?Airway ?Mallampati: I ? ?TM Distance: >3 FB ?Neck ROM: Full ? ? ? Dental ? ?(+) Lower Dentures, Upper Dentures ?  ?Pulmonary ?neg pulmonary ROS, former smoker,  ?  ?breath sounds clear to auscultation ? ? ? ? ? ? Cardiovascular ?hypertension, Pt. on medications ? ?Rhythm:Regular Rate:Normal ? ? ?  ?Neuro/Psych ?negative neurological ROS ? negative psych ROS  ? GI/Hepatic ?Neg liver ROS, GERD  Medicated,  ?Endo/Other  ?negative endocrine ROS ? Renal/GU ?negative Renal ROS  ? ?  ?Musculoskeletal ?negative musculoskeletal ROS ?(+)  ? Abdominal ?Normal abdominal exam  (+)   ?Peds ? Hematology ?negative hematology ROS ?(+)   ?Anesthesia Other Findings ?- Neck mass; pt denies difficulty breathing when laying flat ? Reproductive/Obstetrics ? ?  ? ? ? ? ? ? ? ? ? ? ? ? ? ?  ?  ? ? ? ? ? ? ? ? ?Anesthesia Physical ?Anesthesia Plan ? ?ASA: 2 ? ?Anesthesia Plan: General  ? ?Post-op Pain Management:   ? ?Induction: Intravenous ? ?PONV Risk Score and Plan:  ? ?Airway Management Planned:  ? ?Additional Equipment:  ? ?Intra-op Plan:  ? ?Post-operative Plan:  ? ?Informed Consent:  ? ?Plan Discussed with:  ? ?Anesthesia Plan Comments:   ? ? ? ? ? ? ?Anesthesia Quick Evaluation ? ?

## 2022-01-11 ENCOUNTER — Encounter (HOSPITAL_COMMUNITY): Payer: Self-pay | Admitting: Surgery

## 2022-01-11 DIAGNOSIS — E042 Nontoxic multinodular goiter: Secondary | ICD-10-CM | POA: Diagnosis not present

## 2022-01-11 NOTE — Progress Notes (Signed)
Reviewed written d/c instructions w pt and all questions answered. She verbalized understanding. D/C via w/c w all belongings in stable condition. 

## 2022-01-11 NOTE — Progress Notes (Signed)
Patient ID: Briana Koch, female   DOB: [birth year], 81 y.o.   MRN: [id number] ? ? ?Doing well ?Up and eating breakfast ?Neck incision clean, no hematoma, voice normal ? ?Plan: ?Discharge home ?

## 2022-01-11 NOTE — Discharge Summary (Signed)
Physician Discharge Summary  ?Patient ID: ?Briana Koch ?MRN: [id number] ?DOB/AGE: [birth year] 81 y.o. ? ?Admit date: 01/10/2022 ?Discharge date: 01/11/2022 ? ?Admission Diagnoses: ? ?Discharge Diagnoses:  ?Principal Problem: ?  Multinodular goiter ?Active Problems: ?  Multinodular goiter (nontoxic) ? ? ?Discharged Condition: good ? ?Hospital Course: uneventful post op recovery.  Discharged home POD#1 doing well ? ?Consults: None ? ?Significant Diagnostic Studies:  ? ?Treatments: surgery: left thyroid lobectomy ? ?Discharge Exam: ?Blood pressure 121/68, pulse 77, temperature (!) 97.5 ?F (36.4 ?C), temperature source Oral, resp. rate 18, height 5\' 7"  (1.702 m), weight 71.3 kg, SpO2 100 %. ?General appearance: alert, cooperative, and no distress ?Resp: clear to auscultation bilaterally ?Cardio: regular rate and rhythm, S1, S2 normal, no murmur, click, rub or gallop ?Incision/Wound: neck incision clean without hematoma ? ?Disposition: Discharge disposition: 01-Home or Self Care ? ? ? ? ? ? ? ?Allergies as of 01/11/2022   ?No Known Allergies ?  ? ?  ?Medication List  ?  ? ?TAKE these medications   ? ?amLODipine 5 MG tablet ?Commonly known as: NORVASC ?Take 5 mg by mouth daily. ?  ?atorvastatin 10 MG tablet ?Commonly known as: LIPITOR ?Take 10 mg by mouth daily. ?  ?pantoprazole 40 MG tablet ?Commonly known as: PROTONIX ?Take 40 mg by mouth every morning. ?  ?traMADol 50 MG tablet ?Commonly known as: ULTRAM ?Take 1-2 tablets (50-100 mg total) by mouth every 6 (six) hours as needed for moderate pain. ?  ?valsartan-hydrochlorothiazide 320-25 MG tablet ?Commonly known as: DIOVAN-HCT ?Take 1 tablet by mouth daily. ?  ? ?  ? ? Follow-up Information   ? ? Armandina Gemma, MD. Schedule an appointment as soon as possible for a visit in 3 week(s).   ?Specialty: General Surgery ?Why: For wound re-check ?Contact information: ?[address] ?[address] ?[place] [address] ?[phone] ? ? ?  ?  ? ?  ?  ? ?  ? ? ?Signed: ?Coralie Keens ?01/11/2022, 9:20 AM ? ? ?

## 2022-01-13 LAB — SURGICAL PATHOLOGY

## 2022-01-13 NOTE — Progress Notes (Signed)
Pathology is benign, as expected.  Good news. ? ?tmg ? ?Darnell Level, MD ?[facility] ?A [facility] practice ?Office: [phone] ?

## 2022-03-13 ENCOUNTER — Encounter: Payer: Self-pay | Admitting: "Endocrinology

## 2022-03-13 ENCOUNTER — Ambulatory Visit (INDEPENDENT_AMBULATORY_CARE_PROVIDER_SITE_OTHER): Payer: Medicare PPO | Admitting: "Endocrinology

## 2022-03-13 VITALS — BP 118/58 | HR 84 | Ht 66.0 in | Wt 158.0 lb

## 2022-03-13 DIAGNOSIS — E890 Postprocedural hypothyroidism: Secondary | ICD-10-CM | POA: Diagnosis not present

## 2022-03-13 NOTE — Progress Notes (Signed)
03/13/2022, 7:03 PM  Endocrinology follow-up note   Subjective:    Patient ID: Briana Koch, female    DOB: May 25, 1941, PCP Aggie Cosier, MD   Past Medical History:  Diagnosis Date   Anemia    GERD (gastroesophageal reflux disease)    Hypertension    Multinodular goiter    Pneumonia    Past Surgical History:  Procedure Laterality Date   MULTIPLE TOOTH EXTRACTIONS     THYROID LOBECTOMY Left 01/10/2022   Procedure: LEFT THYROID LOBECTOMY;  Surgeon: Darnell Level, MD;  Location: [facility];  Service: General;  Laterality: Left;   TOE AMPUTATION Bilateral    Social History   Socioeconomic History   Marital status: Married    Spouse name: Not on file   Number of children: Not on file   Years of education: Not on file   Highest education level: Not on file  Occupational History   Not on file  Tobacco Use   Smoking status: Former    Types: Cigarettes    Quit date: 2016    Years since quitting: 7.5   Smokeless tobacco: Never   Tobacco comments:    Pt smoked 3 per week before quitting  Vaping Use   Vaping Use: Never used  Substance and Sexual Activity   Alcohol use: Not Currently   Drug use: Never   Sexual activity: Not on file  Other Topics Concern   Not on file  Social History Narrative   Not on file   Social Determinants of Health   Financial Resource Strain: Not on file  Food Insecurity: Not on file  Transportation Needs: Not on file  Physical Activity: Not on file  Stress: Not on file  Social Connections: Not on file   Outpatient Encounter Medications as of 03/13/2022  Medication Sig   amLODipine (NORVASC) 5 MG tablet Take 5 mg by mouth daily.   atorvastatin (LIPITOR) 10 MG tablet Take 10 mg by mouth daily.   pantoprazole (PROTONIX) 40 MG tablet Take 40 mg by mouth every morning.   traMADol (ULTRAM) 50 MG tablet Take 1-2 tablets (50-100 mg total) by mouth every 6 (six) hours as needed for moderate  pain.   valsartan-hydrochlorothiazide (DIOVAN-HCT) 320-25 MG tablet Take 1 tablet by mouth daily.   No facility-administered encounter medications on file as of 03/13/2022.   ALLERGIES: No Known Allergies  VACCINATION STATUS:  There is no immunization history on file for this patient.  HPI Briana Koch is 81 y.o. female who is being seen in follow-up for euthyroid multinodular goiter.    -This patient was seen previously in this practice for multinodular goiter known since March 2015.   After her last visit which was significant for presentation with compressive symptoms, she was sent for surgical intervention.  She underwent surgery on Jan 10, 2022 with surgical sample showing multiple adenomatoid follicular nodules in the left lobe and isthmus without heartened cell features and without malignancy. She reports feeling better, breathing easier this days.  She denies any new voice change.  She swallows better than before.    Her recent thyroid function tests involved only TSH which was within normal limits.  she is a former heavy smoker.  She denies  any family history of thyroid cancer.  -She did have biopsy remotely with benign findings. -She denies palpitations, tremors, nor heat/cold intolerance.   Review of Systems  Limited as above.  Objective:    BP (!) 118/58   Pulse 84   Ht 5\' 6"  (1.676 m)   Wt 158 lb (71.7 kg)   BMI 25.50 kg/m   Wt Readings from Last 3 Encounters:  03/13/22 158 lb (71.7 kg)  01/10/22 157 lb 3.2 oz (71.3 kg)  01/02/22 157 lb 3.2 oz (71.3 kg)    Physical Exam  Well-healed post thyroidectomy scar on anterior lower neck.   Recent Results (from the past 2160 hour(s))  Basic metabolic panel per protocol     Status: Abnormal   Collection Time: 01/02/22 10:12 AM  Result Value Ref Range   Sodium 140 135 - 145 mmol/L   Potassium 3.7 3.5 - 5.1 mmol/L   Chloride 108 98 - 111 mmol/L   CO2 28 22 - 32 mmol/L   Glucose, Bld 101 (H) 70 - 99 mg/dL     Comment: Glucose reference range applies only to samples taken after fasting for at least 8 hours.   BUN 25 (H) 8 - 23 mg/dL   Creatinine, Ser 03/04/22 0.44 - 1.00 mg/dL   Calcium 9.4 8.9 - 2.37 mg/dL   GFR, Estimated 62.8 >31 mL/min    Comment: (NOTE) Calculated using the CKD-EPI Creatinine Equation (2021)    Anion gap 4 (L) 5 - 15    Comment: Performed at [facility], [address] [place], [place]  CBC per protocol     Status: Abnormal   Collection Time: 01/02/22 10:12 AM  Result Value Ref Range   WBC 5.5 4.0 - 10.5 K/uL   RBC 4.20 3.87 - 5.11 MIL/uL   Hemoglobin 11.4 (L) 12.0 - 15.0 g/dL   HCT 03/04/22 (L) 73.7 - 10.6 %   MCV 79.0 (L) 80.0 - 100.0 fL   MCH 27.1 26.0 - 34.0 pg   MCHC 34.3 30.0 - 36.0 g/dL   RDW 26.9 48.5 - 46.2 %   Platelets 239 150 - 400 K/uL   nRBC 0.0 0.0 - 0.2 %    Comment: Performed at [facility], [address], [place], [place]  Surgical pathology     Status: None   Collection Time: 01/10/22  9:19 AM  Result Value Ref Range   SURGICAL PATHOLOGY      SURGICAL PATHOLOGY CASE: [id number] PATIENT: 12-30-1997 Surgical Pathology Report     Clinical History: Multinodular goiter with compressive symptoms (crm)     FINAL MICROSCOPIC DIAGNOSIS:  A.   THYROID, LEFT AND ISTHMUS, LOBECTOMY: -    Multiple adenomatoid follicular nodules (multinodular goiter). 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 -    No malignancy identified.    GROSS DESCRIPTION:  Received fresh is a 6 105 g portion of thyroid, clinically left lobe and isthmus.  There is a suture attached identifying the isthmus margin. The left lobe measures 6.8 x 5.2 x 3.8 cm and the isthmus measures 4.3 x 4 x 3.2 cm.  The margins are inked prior to sectioning.  The parenchyma throughout is nodular, tan-brown to red and focally edematous.  In the mid left lobe there is a 0.9 cm well-circumscribed tan-yellow  nodule. Sections are submitted in 6 cassettes. 1 = isthmus margin, en face 2 = left lobe nodule 3-6 = representative sections  (GRP  01/10/2022)   Final Diagnosis performed by Napoleon Form, MD.   Electronically signed 01/13/2022 Technical component performed at [facility], [address], [place], [place] [address].  Professional component performed at [employer]. [facility], [address] [place], [place] [address].  Immunohistochemistry Technical component (if applicable) was performed at [facility]. [address], [place], [place] [address].   IMMUNOHISTOCHEMISTRY DISCLAIMER (if applicable): Some of these immunohistochemical stains may have been developed and the performance characteristics determine by [facility]. Some may not have been cleared or approved by the U.S. Food and Drug Administration. The FDA has determined that such clearance or approval is not necessary. This test is used for clinical purposes. It should not be regarded as investigational or for research. This laboratory is certified under the Clinica l Laboratory Improvement Amendments of 1988 (CLIA-88) as qualified to perform high complexity clinical laboratory testing.  The controls stained appropriately.      Assessment & Plan:   1. Multinodular goiter with compressive symptoms-status post left/isthmus hemithyroidectomy -Her compressive symptoms are now resolved.  I have discussed her surgical findings with her. Her surgical samples are negative for malignancy.  She still has her right lobe of her thyroid.  Her recent TSH was normal.  She will not need any thyroid hormone supplement at this time.  She is approached to return with repeat thyroid function test in 3 months for reevaluation.   - I advised her  to maintain close follow up with Aggie Cosier, MD for primary care needs.   I spent 21 minutes in the care of the patient  today including review of labs from Thyroid Function, CMP, and other relevant labs ; imaging/biopsy records (current and previous including abstractions from other facilities); face-to-face time discussing  her lab results and symptoms, medications doses, her options of short and long term treatment based on the latest standards of care / guidelines;   and documenting the encounter.  Aemilia Phagan  participated in the discussions, expressed understanding, and voiced agreement with the above plans.  All questions were answered to her satisfaction. she is encouraged to contact clinic should she have any questions or concerns prior to her return visit.   Follow up plan: Return in about 3 months (around 06/13/2022) for F/U with Pre-visit Labs.   Marquis Lunch, MD [facility] [address] [place], [place] [address] Phone: [phone]  Fax: [phone]     03/13/2022, 7:03 PM  This note was partially dictated with voice recognition software. Similar sounding words can be transcribed inadequately or may not  be corrected upon review.

## 2022-04-04 ENCOUNTER — Telehealth: Payer: Self-pay | Admitting: "Endocrinology

## 2022-04-04 NOTE — Telephone Encounter (Signed)
New message   [facility]   What the plan of care after the thyroid ultrasound.

## 2022-04-07 NOTE — Telephone Encounter (Signed)
Lawrence Marseilles left a VM to return her call at [phone]

## 2022-04-07 NOTE — Telephone Encounter (Signed)
Left a message requesting a return call to the office. 

## 2022-04-07 NOTE — Telephone Encounter (Signed)
Wanted to clarify if you want pt to have an ultrasound of her thyroid. Her last one was 04/2021.

## 2022-06-10 LAB — BASIC METABOLIC PANEL
BUN: 23 — AB (ref 4–21)
CO2: 30 — AB (ref 13–22)
Chloride: 106 (ref 99–108)
Creatinine: 1 (ref 0.5–1.1)
Glucose: 94
Potassium: 3.6 mEq/L (ref 3.5–5.1)
Sodium: 140 (ref 137–147)

## 2022-06-10 LAB — HEPATIC FUNCTION PANEL
ALT: 24 U/L (ref 7–35)
AST: 16 (ref 13–35)
Alkaline Phosphatase: 74 (ref 25–125)
Bilirubin, Total: 0.5

## 2022-06-10 LAB — COMPREHENSIVE METABOLIC PANEL
Albumin: 3.6 (ref 3.5–5.0)
Calcium: 9.2 (ref 8.7–10.7)

## 2022-06-10 LAB — VITAMIN D 25 HYDROXY (VIT D DEFICIENCY, FRACTURES): Vit D, 25-Hydroxy: 22

## 2022-06-10 LAB — TSH: TSH: 2.37 (ref 0.41–5.90)

## 2022-06-11 ENCOUNTER — Encounter: Payer: Self-pay | Admitting: "Endocrinology

## 2022-06-16 ENCOUNTER — Ambulatory Visit (INDEPENDENT_AMBULATORY_CARE_PROVIDER_SITE_OTHER): Payer: Medicare PPO | Admitting: "Endocrinology

## 2022-06-16 ENCOUNTER — Encounter: Payer: Self-pay | Admitting: "Endocrinology

## 2022-06-16 VITALS — BP 120/66 | HR 80 | Ht 66.0 in | Wt 159.2 lb

## 2022-06-16 DIAGNOSIS — E890 Postprocedural hypothyroidism: Secondary | ICD-10-CM | POA: Diagnosis not present

## 2022-06-16 NOTE — Progress Notes (Signed)
10/16/Koch, 10:06 AM  Endocrinology follow-up note   Subjective:    Patient ID: Briana Koch, female    DOB: 01/27/1941, PCP Briana Rad, MD   Past Medical History:  Diagnosis Date   Anemia    GERD (gastroesophageal reflux disease)    Hypertension    Multinodular goiter    Pneumonia    Past Surgical History:  Procedure Laterality Date   MULTIPLE TOOTH EXTRACTIONS     THYROID LOBECTOMY Left 5/12/Koch   Procedure: LEFT THYROID LOBECTOMY;  Surgeon: Briana Gemma, MD;  Location: [facility];  Service: General;  Laterality: Left;   TOE AMPUTATION Bilateral    Social History   Socioeconomic History   Marital status: Married    Spouse name: Not on file   Number of children: Not on file   Years of education: Not on file   Highest education level: Not on file  Occupational History   Not on file  Tobacco Use   Smoking status: Former    Types: Cigarettes    Quit date: 2016    Years since quitting: 7.7   Smokeless tobacco: Never   Tobacco comments:    Pt smoked 3 per week before quitting  Vaping Use   Vaping Use: Never used  Substance and Sexual Activity   Alcohol use: Not Currently   Drug use: Never   Sexual activity: Not on file  Other Topics Concern   Not on file  Social History Narrative   Not on file   Social Determinants of Health   Financial Resource Strain: Not on file  Food Insecurity: Not on file  Transportation Needs: Not on file  Physical Activity: Not on file  Stress: Not on file  Social Connections: Not on file   Outpatient Encounter Medications as of 10/16/Koch  Medication Sig   Ferrous Sulfate (IRON) 325 (65 Fe) MG TABS Take 1 tablet by mouth every other day.   amLODipine (NORVASC) 5 MG tablet Take 5 mg by mouth daily.   atorvastatin (LIPITOR) 10 MG tablet Take 10 mg by mouth daily.   pantoprazole (PROTONIX) 40 MG tablet Take 40 mg by mouth every morning.   traMADol (ULTRAM) 50 MG tablet  Take 1-2 tablets (50-100 mg total) by mouth every 6 (six) hours as needed for moderate pain. (Patient not taking: Reported on 10/16/Koch)   valsartan-hydrochlorothiazide (DIOVAN-HCT) 320-25 MG tablet Take 1 tablet by mouth daily.   No facility-administered encounter medications on file as of 10/16/Koch.   ALLERGIES: No Known Allergies  VACCINATION STATUS:  There is no immunization history on file for this patient.  HPI Briana Koch is 81 y.o. female who is being seen in follow-up for euthyroid multinodular goiter.    -This patient was seen previously in this practice for multinodular goiter known since March 2015.   She underwent partial thyroidectomy in May Koch.  Surgical sample showing multiple adenomatoid follicular nodules in the left lobe and isthmus with benign findings.    She did not require thyroid hormone replacement. She reports feeling better, breathing easier this days.  She denies any new voice change.  She swallows better than before.    Her recent thyroid function tests show euthyroid presentation.    she  is a former heavy smoker.  She denies any family history of thyroid cancer.  -She did have biopsy remotely with benign findings. -She denies palpitations, tremors, nor heat/cold intolerance.   Review of Systems  Limited as above.  Objective:    BP 120/66   Pulse 80   Ht 5\' 6"  (1.676 m)   Wt 159 lb 3.2 oz (72.2 kg)   BMI 25.70 kg/m   Wt Readings from Last 3 Encounters:  06/16/22 159 lb 3.2 oz (72.2 kg)  03/13/22 158 lb (71.7 kg)  01/10/22 157 lb 3.2 oz (71.3 kg)    Physical Exam  Well-healed post thyroidectomy scar on anterior lower neck.   Recent Results (from the past 2160 hour(s))  VITAMIN D 25 Hydroxy (Vit-D Deficiency, Fractures)     Status: None   Collection Time: 06/10/22 12:00 AM  Result Value Ref Range   Vit D, 25-Hydroxy 22   Basic metabolic panel     Status: Abnormal   Collection Time: 06/10/22 12:00 AM  Result Value Ref Range    Glucose 94    BUN 23 (A) 4 - 21   CO2 30 (A) 13 - 22   Creatinine 1.0 0.5 - 1.1   Potassium 3.6 3.5 - 5.1 mEq/L   Sodium 140 137 - 147   Chloride 106 99 - 108  Comprehensive metabolic panel     Status: None   Collection Time: 06/10/22 12:00 AM  Result Value Ref Range   Calcium 9.2 8.7 - 10.7   Albumin 3.6 3.5 - 5.0  Hepatic function panel     Status: None   Collection Time: 06/10/22 12:00 AM  Result Value Ref Range   Alkaline Phosphatase 74 25 - 125   ALT 24 7 - 35 U/L   AST 16 13 - 35   Bilirubin, Total 0.5   TSH     Status: None   Collection Time: 06/10/22 12:00 AM  Result Value Ref Range   TSH 2.37 0.41 - 5.90    Comment: T4 free 1.08     Assessment & Plan:   1. Multinodular goiter with compressive symptoms-status post left/isthmus hemithyroidectomy in May Koch. -Her compressive symptoms are now resolved.  I have discussed her surgical findings with her. Her surgical samples are negative for malignancy.  Her previsit thyroid function tests are still consistent with euthyroid state.  She will not need  She is approached to return with repeat thyroid function test in 3 months for reevaluation.   - I advised her  to maintain close follow up with Briana 2023, MD for primary care needs.   I spent 21 minutes in the care of the patient today including review of labs from Thyroid Function, CMP, and other relevant labs ; imaging/biopsy records (current and previous including abstractions from other facilities); face-to-face time discussing  her lab results and symptoms, medications doses, her options of short and long term treatment based on the latest standards of care / guidelines;   and documenting the encounter.  Briana Koch  participated in the discussions, expressed understanding, and voiced agreement with the above plans.  All questions were answered to her satisfaction. she is encouraged to contact clinic should she have any questions or concerns prior to her  return visit.    Follow up plan: Return in about 1 year (around 06/17/2023) for F/U with Pre-visit Labs.   06/19/2023, MD [facility] [address] [place], [place] Phone: [phone]  Fax: [phone]     10/16/Koch, 10:06 AM  This note was partially dictated with voice recognition software. Similar sounding words can be transcribed inadequately or may not  be corrected upon review.

## 2022-07-07 ENCOUNTER — Encounter: Payer: Self-pay | Admitting: "Endocrinology

## 2023-06-08 ENCOUNTER — Telehealth: Payer: Self-pay | Admitting: "Endocrinology

## 2023-06-08 DIAGNOSIS — E042 Nontoxic multinodular goiter: Secondary | ICD-10-CM

## 2023-06-08 DIAGNOSIS — E785 Hyperlipidemia, unspecified: Secondary | ICD-10-CM

## 2023-06-08 LAB — LIPID PANEL
Cholesterol: 152 (ref 0–200)
HDL: 65 (ref 35–70)
LDL Cholesterol: 72
Triglycerides: 74 (ref 40–160)

## 2023-06-08 LAB — TSH: TSH: 2.22 (ref 0.41–5.90)

## 2023-06-08 NOTE — Telephone Encounter (Signed)
Please update labs

## 2023-06-09 NOTE — Telephone Encounter (Signed)
Order updated

## 2023-06-10 ENCOUNTER — Encounter: Payer: Self-pay | Admitting: "Endocrinology

## 2023-06-17 ENCOUNTER — Encounter: Payer: Self-pay | Admitting: "Endocrinology

## 2023-06-17 ENCOUNTER — Ambulatory Visit: Payer: Medicare PPO | Admitting: "Endocrinology

## 2023-06-17 ENCOUNTER — Ambulatory Visit (INDEPENDENT_AMBULATORY_CARE_PROVIDER_SITE_OTHER): Payer: Medicare PPO | Admitting: "Endocrinology

## 2023-06-17 VITALS — BP 110/62 | HR 72 | Ht 67.0 in | Wt 154.6 lb

## 2023-06-17 DIAGNOSIS — E042 Nontoxic multinodular goiter: Secondary | ICD-10-CM

## 2023-06-17 NOTE — Progress Notes (Signed)
06/17/2023, 10:12 AM  Endocrinology follow-up note   Subjective:    Patient ID: [name], female    DOB: Jul 21, 1941, PCP Aggie Cosier, MD   Past Medical History:  Diagnosis Date   Anemia    GERD (gastroesophageal reflux disease)    Hypertension    Multinodular goiter    Pneumonia    Past Surgical History:  Procedure Laterality Date   MULTIPLE TOOTH EXTRACTIONS     THYROID LOBECTOMY Left 01/10/2022   Procedure: LEFT THYROID LOBECTOMY;  Surgeon: Darnell Level, MD;  Location: [facility];  Service: General;  Laterality: Left;   TOE AMPUTATION Bilateral    Social History   Socioeconomic History   Marital status: Married    Spouse name: Not on file   Number of children: Not on file   Years of education: Not on file   Highest education level: Not on file  Occupational History   Not on file  Tobacco Use   Smoking status: Former    Current packs/day: 0.00    Types: Cigarettes    Quit date: 2016    Years since quitting: 8.7   Smokeless tobacco: Never   Tobacco comments:    Pt smoked 3 per week before quitting  Vaping Use   Vaping status: Never Used  Substance and Sexual Activity   Alcohol use: Not Currently   Drug use: Never   Sexual activity: Not on file  Other Topics Concern   Not on file  Social History Narrative   Not on file   Social Determinants of Health   Financial Resource Strain: Not on file  Food Insecurity: Not on file  Transportation Needs: Not on file  Physical Activity: Not on file  Stress: Not on file  Social Connections: Not on file   Outpatient Encounter Medications as of 06/17/2023  Medication Sig   amLODipine (NORVASC) 5 MG tablet Take 5 mg by mouth daily.   atorvastatin (LIPITOR) 10 MG tablet Take 10 mg by mouth daily.   Ferrous Sulfate (IRON) 325 (65 Fe) MG TABS Take 1 tablet by mouth every other day.   pantoprazole (PROTONIX) 40 MG tablet Take 40 mg by mouth every morning.    traMADol (ULTRAM) 50 MG tablet Take 1-2 tablets (50-100 mg total) by mouth every 6 (six) hours as needed for moderate pain. (Patient not taking: Reported on 06/16/2022)   valsartan-hydrochlorothiazide (DIOVAN-HCT) 320-25 MG tablet Take 1 tablet by mouth daily.   No facility-administered encounter medications on file as of 06/17/2023.   ALLERGIES: No Known Allergies  VACCINATION STATUS: Immunization History  Administered Date(s) Administered   PFIZER(Purple Top)SARS-COV-2 Vaccination 10/04/2019, 10/25/2019, 07/11/2020   Pfizer Covid-19 Vaccine Bivalent Booster 23yrs & up 06/07/2021   Pfizer(Comirnaty)Fall Seasonal Vaccine 12 years and older 06/09/2022    HPI Briana Koch is 82 y.o. female who is being seen in follow-up for euthyroid multinodular goiter.    -This patient was seen previously in this practice for multinodular goiter known since March 2015.   She underwent partial thyroidectomy in May 2023.  Surgical sample showing multiple adenomatoid follicular nodules in the left lobe and isthmus with benign findings.    -She did not require thyroid hormone replacement.  She presents with  no new complaints.  She  reports feeling better, breathing easier this days.  She denies any new voice change.  She swallows better than before.    Her recent thyroid function tests show euthyroid presentation.    she is a former heavy smoker.  She denies any family history of thyroid cancer.  -She did have biopsy remotely with benign findings. -She denies palpitations, tremors, nor heat/cold intolerance.   Review of Systems  Limited as above.  Objective:    BP 110/62   Pulse 72   Ht 5\' 7"  (1.702 m)   Wt 154 lb 9.6 oz (70.1 kg)   BMI 24.21 kg/m   Wt Readings from Last 3 Encounters:  06/17/23 154 lb 9.6 oz (70.1 kg)  06/16/22 159 lb 3.2 oz (72.2 kg)  03/13/22 158 lb (71.7 kg)    Physical Exam  Well-healed post thyroidectomy scar on anterior lower neck.   Recent Results (from the  past 2160 hour(s))  Lipid panel     Status: None   Collection Time: 06/08/23 12:00 AM  Result Value Ref Range   Triglycerides 74 40 - 160   Cholesterol 152 0 - 200   HDL 65 35 - 70   LDL Cholesterol 72   TSH     Status: None   Collection Time: 06/08/23 12:00 AM  Result Value Ref Range   TSH 2.22 0.41 - 5.90    Comment: Free T4 1.12     Assessment & Plan:   1. Multinodular goiter  -  compressive symptoms-status post left/isthmus hemithyroidectomy in May 2023. -Her compressive symptoms are now resolved.  I have discussed her surgical findings with her. Her surgical samples are negative for malignancy.  Her previsit thyroid function tests are consistent with euthyroid state.  She will not need intervention without hormone at this time.   She will need thyroid function test measurement at least every 6 months with her primary care doctor.  She will return to clinic if needed.   - I advised her  to maintain close follow up with Aggie Cosier, MD for primary care needs.  I spent  22  minutes in the care of the patient today including review of labs from Thyroid Function, CMP, and other relevant labs ; imaging/biopsy records (current and previous including abstractions from other facilities); face-to-face time discussing  her lab results and symptoms, medications doses, her options of short and long term treatment based on the latest standards of care / guidelines;   and documenting the encounter.  [name]  participated in the discussions, expressed understanding, and voiced agreement with the above plans.  All questions were answered to her satisfaction. she is encouraged to contact clinic should she have any questions or concerns prior to her return visit.   Follow up plan: Return if symptoms worsen or fail to improve.   Marquis Lunch, MD [facility] [address] [place], [place] [address] Phone: [phone]  Fax:  [phone]     06/17/2023, 10:12 AM  This note was partially dictated with voice recognition software. Similar sounding words can be transcribed inadequately or may not  be corrected upon review.
